# Patient Record
Sex: Female | Born: 1980 | Race: White | Hispanic: No | Marital: Married | Smoking: Never smoker
Health system: Southern US, Community
[De-identification: ages and names within clinical notes are randomized; demographics above are authoritative.]

## PROBLEM LIST (undated history)

## (undated) ENCOUNTER — Inpatient Hospital Stay (HOSPITAL_COMMUNITY): Payer: BC Managed Care – PPO

## (undated) ENCOUNTER — Inpatient Hospital Stay (HOSPITAL_COMMUNITY): Payer: Self-pay

## (undated) DIAGNOSIS — T7840XA Allergy, unspecified, initial encounter: Secondary | ICD-10-CM

## (undated) DIAGNOSIS — Z87442 Personal history of urinary calculi: Secondary | ICD-10-CM

## (undated) DIAGNOSIS — E079 Disorder of thyroid, unspecified: Secondary | ICD-10-CM

## (undated) DIAGNOSIS — Z98891 History of uterine scar from previous surgery: Secondary | ICD-10-CM

## (undated) DIAGNOSIS — K219 Gastro-esophageal reflux disease without esophagitis: Secondary | ICD-10-CM

## (undated) DIAGNOSIS — M1990 Unspecified osteoarthritis, unspecified site: Secondary | ICD-10-CM

## (undated) HISTORY — PX: BLADDER SURGERY: SHX569

## (undated) HISTORY — PX: WISDOM TOOTH EXTRACTION: SHX21

## (undated) SURGERY — Surgical Case
Anesthesia: *Unknown

---

## 2010-02-19 ENCOUNTER — Inpatient Hospital Stay (HOSPITAL_COMMUNITY)
Admission: AD | Admit: 2010-02-19 | Discharge: 2010-02-22 | Payer: Self-pay | Source: Home / Self Care | Attending: Obstetrics and Gynecology | Admitting: Obstetrics and Gynecology

## 2010-05-14 LAB — CBC
Hemoglobin: 11.3 g/dL — ABNORMAL LOW (ref 12.0–15.0)
MCHC: 33.6 g/dL (ref 30.0–36.0)
Platelets: 187 10*3/uL (ref 150–400)
RBC: 3.67 MIL/uL — ABNORMAL LOW (ref 3.87–5.11)
RBC: 4.02 MIL/uL (ref 3.87–5.11)
WBC: 12.8 10*3/uL — ABNORMAL HIGH (ref 4.0–10.5)
WBC: 18 10*3/uL — ABNORMAL HIGH (ref 4.0–10.5)

## 2010-05-14 LAB — RPR: RPR Ser Ql: NONREACTIVE

## 2010-05-14 LAB — ABO/RH: ABO/RH(D): O POS

## 2012-02-11 LAB — OB RESULTS CONSOLE RUBELLA ANTIBODY, IGM: Rubella: IMMUNE

## 2012-02-11 LAB — OB RESULTS CONSOLE HEPATITIS B SURFACE ANTIGEN: Hepatitis B Surface Ag: NEGATIVE

## 2012-05-11 ENCOUNTER — Emergency Department (HOSPITAL_BASED_OUTPATIENT_CLINIC_OR_DEPARTMENT_OTHER)
Admission: EM | Admit: 2012-05-11 | Discharge: 2012-05-11 | Discharge status: Against Medical Advice | Payer: BC Managed Care – PPO | Attending: Emergency Medicine | Admitting: Emergency Medicine

## 2012-05-11 ENCOUNTER — Encounter (HOSPITAL_BASED_OUTPATIENT_CLINIC_OR_DEPARTMENT_OTHER): Payer: Self-pay

## 2012-05-11 DIAGNOSIS — R21 Rash and other nonspecific skin eruption: Secondary | ICD-10-CM | POA: Insufficient documentation

## 2012-05-11 DIAGNOSIS — O9989 Other specified diseases and conditions complicating pregnancy, childbirth and the puerperium: Secondary | ICD-10-CM | POA: Insufficient documentation

## 2012-05-11 NOTE — ED Notes (Signed)
C/o allergic reactions since age 32-states she took benadryl PTA is feeling fine at this time-pt concerned due to she is [redacted] weeks pregnant-pt NAD

## 2012-06-04 LAB — OB RESULTS CONSOLE RPR: RPR: NONREACTIVE

## 2012-08-07 ENCOUNTER — Other Ambulatory Visit: Payer: Self-pay | Admitting: Obstetrics and Gynecology

## 2012-08-15 ENCOUNTER — Encounter (HOSPITAL_COMMUNITY): Payer: Self-pay | Admitting: *Deleted

## 2012-08-15 ENCOUNTER — Inpatient Hospital Stay (HOSPITAL_COMMUNITY)
Admission: AD | Admit: 2012-08-15 | Discharge: 2012-08-15 | Disposition: A | Discharge status: Home / Self Care | Payer: BC Managed Care – PPO | Source: Ambulatory Visit | Attending: Obstetrics and Gynecology | Admitting: Obstetrics and Gynecology

## 2012-08-15 DIAGNOSIS — R112 Nausea with vomiting, unspecified: Secondary | ICD-10-CM | POA: Insufficient documentation

## 2012-08-15 DIAGNOSIS — O479 False labor, unspecified: Secondary | ICD-10-CM | POA: Insufficient documentation

## 2012-08-15 DIAGNOSIS — K625 Hemorrhage of anus and rectum: Secondary | ICD-10-CM | POA: Insufficient documentation

## 2012-08-15 HISTORY — DX: Allergy, unspecified, initial encounter: T78.40XA

## 2012-08-15 NOTE — MAU Note (Signed)
Pt reports being up all night with contractions and diarrhea . Reports she noticed some rectal bleeding when wiping earlier this morning. Good fetal movement reported.

## 2012-08-15 NOTE — Progress Notes (Signed)
Notified pt has been having diarrhea mixed with blood several times this morning and just had another episiode. FHR tracing not reactive at this time. Order to PO hydrate pt.

## 2012-08-20 ENCOUNTER — Encounter (HOSPITAL_COMMUNITY): Payer: Self-pay | Admitting: Pharmacist

## 2012-08-25 ENCOUNTER — Encounter (HOSPITAL_COMMUNITY): Payer: Self-pay | Admitting: *Deleted

## 2012-08-26 ENCOUNTER — Other Ambulatory Visit (HOSPITAL_COMMUNITY): Payer: BC Managed Care – PPO

## 2012-08-26 ENCOUNTER — Encounter (HOSPITAL_COMMUNITY): Payer: Self-pay

## 2012-08-26 ENCOUNTER — Encounter (HOSPITAL_COMMUNITY)
Admission: RE | Admit: 2012-08-26 | Discharge: 2012-08-26 | Disposition: A | Discharge status: Home / Self Care | Payer: BC Managed Care – PPO | Source: Ambulatory Visit | Attending: Obstetrics and Gynecology | Admitting: Obstetrics and Gynecology

## 2012-08-26 HISTORY — DX: Gastro-esophageal reflux disease without esophagitis: K21.9

## 2012-08-26 LAB — CBC
HCT: 34.7 % — ABNORMAL LOW (ref 36.0–46.0)
Hemoglobin: 11.8 g/dL — ABNORMAL LOW (ref 12.0–15.0)
MCH: 31.3 pg (ref 26.0–34.0)
MCHC: 34 g/dL (ref 30.0–36.0)
MCV: 92 fL (ref 78.0–100.0)
Platelets: 237 10*3/uL (ref 150–400)
RBC: 3.77 MIL/uL — ABNORMAL LOW (ref 3.87–5.11)
RDW: 13.1 % (ref 11.5–15.5)
WBC: 10.9 10*3/uL — ABNORMAL HIGH (ref 4.0–10.5)

## 2012-08-26 LAB — SYPHILIS: RPR W/REFLEX TO RPR TITER AND TREPONEMAL ANTIBODIES, TRADITIONAL SCREENING AND DIAGNOSIS ALGORITHM: RPR Ser Ql: NONREACTIVE

## 2012-08-26 LAB — TYPE AND SCREEN
ABO/RH(D): O POS
Antibody Screen: NEGATIVE

## 2012-08-26 NOTE — Patient Instructions (Addendum)
[address] Cheryl Medina  08/26/2012   Your procedure is scheduled on:  08/28/12  Enter through the Main Entrance of [facility] at 845 AM.  Pick up the phone at the desk and dial [phone].   Call this number if you have problems the morning of surgery: [phone]   Remember:   Do not eat food:After Midnight.  Do not drink clear liquids: After Midnight.  Take these medicines the morning of surgery with A SIP OF WATER: NA   Do not wear jewelry, make-up or nail polish.  Do not wear lotions, powders, or perfumes. You may wear deodorant.  Do not shave 48 hours prior to surgery.  Do not bring valuables to the hospital.  [facility] is not responsible                  for any belongings or valuables brought to the hospital.  Contacts, dentures or bridgework may not be worn into surgery.  Leave suitcase in the car. After surgery it may be brought to your room.  For patients admitted to the hospital, checkout time is 11:00 AM the day of                discharge.   Patients discharged the day of surgery will not be allowed to drive                   home.  Name and phone number of your driver: na  Special Instructions: Shower using CHG 2 nights before surgery and the night before surgery.  If you shower the day of surgery use CHG.  Use special wash - you have one bottle of CHG for all showers.  You should use approximately 1/3 of the bottle for each shower.   Please read over the following fact sheets that you were given: Surgical Site Infection Prevention

## 2012-08-27 NOTE — H&P (Signed)
[name], Cheryl Medina                ACCOUNT NO.:  [id number]  MEDICAL RECORD NO.:  [id number]  LOCATION:  PERIO                         FACILITY:  [facility]  PHYSICIAN:  [name], M.D.DATE OF BIRTH:  03-04-1981  DATE OF ADMISSION:  07/16/2012 DATE OF DISCHARGE:                             HISTORY & PHYSICAL   CHIEF COMPLAINT:  Repeat C-section.  HISTORY OF PRESENT ILLNESS:  A 32 year old white female, G5, P1 at [redacted] weeks gestation for repeat cesarean section.  MEDICATIONS:  Zofran as needed, prenatal vitamins, Pepcid, and Zyrtec as needed.  SOCIAL HISTORY:  She is a nonsmoker, nondrinker.  Denies domestic physical violence.  FAMILY HISTORY:  Heart disease, schizophrenia, kidney stone, skin cancer.  SURGICAL HISTORY:  Remarkable for C-section x1.  She has a history of questionable bladder surgery in 1988.  Prenatal course complicated by multiple unspecified allergic reactions with multiple ER visits due to syncope.  No workup done at this time.  Previous workup reportedly normal.  PHYSICAL EXAMINATION:  GENERAL:  She is a well-developed, well- nourished, white female, in no acute distress. HEENT:  Normal. NECK:  Supple.  Full range of motion. LUNGS:  Clear. HEART:  Regular rhythm. ABDOMEN:  Soft, gravid, nontender.  Estimated fetal weight 6.5 to 7 pounds.  Cervix is closed, 50%, vertex, -2. EXTREMITIES:  There are no cords. NEUROLOGIC:  Nonfocal. SKIN:  Intact.  IMPRESSION: 1. A 39-week intrauterine pregnancy. 2. Presumed __________. 3. Unspecified allergic reaction.  PLAN:  To proceed with elective repeat low segment transverse cesarean section.  Risks of anesthesia, infection, bleeding, injury to surrounding organs, possible need for repair is discussed, delayed versus immediate complications to include bowel and bladder injury noted.  The patient acknowledges and wishes to proceed.     [name], M.D.     RJT/MEDQ  D:  08/27/2012  T:   08/27/2012  Job:  [id number]

## 2012-08-28 ENCOUNTER — Inpatient Hospital Stay (HOSPITAL_COMMUNITY): Payer: BC Managed Care – PPO | Admitting: Anesthesiology

## 2012-08-28 ENCOUNTER — Inpatient Hospital Stay (HOSPITAL_COMMUNITY)
Admission: AD | Admit: 2012-08-28 | Discharge: 2012-08-30 | DRG: 371 | Disposition: A | Discharge status: Home / Self Care | Payer: BC Managed Care – PPO | Source: Ambulatory Visit | Attending: Obstetrics and Gynecology | Admitting: Obstetrics and Gynecology

## 2012-08-28 ENCOUNTER — Encounter (HOSPITAL_COMMUNITY)
Admission: AD | Disposition: A | Discharge status: Home / Self Care | Payer: Self-pay | Source: Ambulatory Visit | Attending: Obstetrics and Gynecology

## 2012-08-28 ENCOUNTER — Encounter (HOSPITAL_COMMUNITY): Payer: Self-pay | Admitting: Anesthesiology

## 2012-08-28 DIAGNOSIS — Z98891 History of uterine scar from previous surgery: Secondary | ICD-10-CM

## 2012-08-28 DIAGNOSIS — O34219 Maternal care for unspecified type scar from previous cesarean delivery: Principal | ICD-10-CM | POA: Diagnosis present

## 2012-08-28 HISTORY — DX: History of uterine scar from previous surgery: Z98.891

## 2012-08-28 SURGERY — Surgical Case
Anesthesia: Spinal | Site: Abdomen | Wound class: Clean Contaminated

## 2012-08-28 MED ORDER — FENTANYL CITRATE 0.05 MG/ML IJ SOLN
INTRAMUSCULAR | Status: DC | PRN
  Administered 2012-08-28: 75 ug via INTRAVENOUS
  Administered 2012-08-28: 25 ug via INTRATHECAL

## 2012-08-28 MED ORDER — PHENYLEPHRINE 40 MCG/ML (10ML) SYRINGE FOR IV PUSH (FOR BLOOD PRESSURE SUPPORT)
PREFILLED_SYRINGE | INTRAVENOUS | Status: AC
  Filled 2012-08-28: qty 5

## 2012-08-28 MED ORDER — SIMETHICONE 80 MG PO CHEW
80.0000 mg | CHEWABLE_TABLET | Freq: Three times a day (TID) | ORAL | Status: DC
  Administered 2012-08-28 – 2012-08-30 (×7): 80 mg via ORAL

## 2012-08-28 MED ORDER — SIMETHICONE 80 MG PO CHEW: 80.0000 mg | CHEWABLE_TABLET | ORAL | Status: DC | PRN

## 2012-08-28 MED ORDER — DIPHENHYDRAMINE HCL 25 MG PO CAPS: 25.0000 mg | ORAL_CAPSULE | ORAL | Status: DC | PRN

## 2012-08-28 MED ORDER — METOCLOPRAMIDE HCL 5 MG/ML IJ SOLN: 10.0000 mg | Freq: Three times a day (TID) | INTRAMUSCULAR | Status: DC | PRN

## 2012-08-28 MED ORDER — MORPHINE SULFATE (PF) 0.5 MG/ML IJ SOLN
INTRAMUSCULAR | Status: DC | PRN
  Administered 2012-08-28: .15 mg via INTRATHECAL

## 2012-08-28 MED ORDER — OXYTOCIN 10 UNIT/ML IJ SOLN
40.0000 [IU] | INTRAVENOUS | Status: DC | PRN
  Administered 2012-08-28: 40 [IU] via INTRAVENOUS

## 2012-08-28 MED ORDER — ONDANSETRON HCL 4 MG/2ML IJ SOLN: 4.0000 mg | INTRAMUSCULAR | Status: DC | PRN

## 2012-08-28 MED ORDER — SODIUM CHLORIDE 0.9 % IJ SOLN: 3.0000 mL | INTRAMUSCULAR | Status: DC | PRN

## 2012-08-28 MED ORDER — NALBUPHINE HCL 10 MG/ML IJ SOLN
5.0000 mg | INTRAMUSCULAR | Status: DC | PRN
  Filled 2012-08-28: qty 1

## 2012-08-28 MED ORDER — ONDANSETRON HCL 4 MG PO TABS: 4.0000 mg | ORAL_TABLET | ORAL | Status: DC | PRN

## 2012-08-28 MED ORDER — WITCH HAZEL-GLYCERIN EX PADS: 1.0000 "application " | MEDICATED_PAD | CUTANEOUS | Status: DC | PRN

## 2012-08-28 MED ORDER — KETOROLAC TROMETHAMINE 30 MG/ML IJ SOLN: 30.0000 mg | Freq: Four times a day (QID) | INTRAMUSCULAR | Status: AC | PRN

## 2012-08-28 MED ORDER — PHENYLEPHRINE 40 MCG/ML (10ML) SYRINGE FOR IV PUSH (FOR BLOOD PRESSURE SUPPORT)
PREFILLED_SYRINGE | INTRAVENOUS | Status: AC
  Filled 2012-08-28: qty 10

## 2012-08-28 MED ORDER — METHYLERGONOVINE MALEATE 0.2 MG/ML IJ SOLN: 0.2000 mg | INTRAMUSCULAR | Status: DC | PRN

## 2012-08-28 MED ORDER — PHENYLEPHRINE HCL 10 MG/ML IJ SOLN
INTRAMUSCULAR | Status: DC | PRN
  Administered 2012-08-28 (×2): 40 ug via INTRAVENOUS
  Administered 2012-08-28: 80 ug via INTRAVENOUS
  Administered 2012-08-28: 40 ug via INTRAVENOUS
  Administered 2012-08-28 (×2): 80 ug via INTRAVENOUS
  Administered 2012-08-28: 40 ug via INTRAVENOUS
  Administered 2012-08-28 (×2): 80 ug via INTRAVENOUS

## 2012-08-28 MED ORDER — OXYTOCIN 40 UNITS IN LACTATED RINGERS INFUSION - SIMPLE MED: 62.5000 mL/h | INTRAVENOUS | Status: AC

## 2012-08-28 MED ORDER — DIBUCAINE 1 % RE OINT: 1.0000 "application " | TOPICAL_OINTMENT | RECTAL | Status: DC | PRN

## 2012-08-28 MED ORDER — DIPHENHYDRAMINE HCL 50 MG/ML IJ SOLN: 12.5000 mg | INTRAMUSCULAR | Status: DC | PRN

## 2012-08-28 MED ORDER — BUPIVACAINE HCL (PF) 0.25 % IJ SOLN
INTRAMUSCULAR | Status: AC
  Filled 2012-08-28: qty 30

## 2012-08-28 MED ORDER — DIPHENHYDRAMINE HCL 50 MG/ML IJ SOLN: 25.0000 mg | INTRAMUSCULAR | Status: DC | PRN

## 2012-08-28 MED ORDER — CEFAZOLIN SODIUM-DEXTROSE 2-3 GM-% IV SOLR
INTRAVENOUS | Status: AC
  Filled 2012-08-28: qty 50

## 2012-08-28 MED ORDER — SCOPOLAMINE 1 MG/3DAYS TD PT72
MEDICATED_PATCH | TRANSDERMAL | Status: AC
  Administered 2012-08-28: 1.5 mg via TRANSDERMAL
  Filled 2012-08-28: qty 1

## 2012-08-28 MED ORDER — MORPHINE SULFATE 0.5 MG/ML IJ SOLN
INTRAMUSCULAR | Status: AC
  Filled 2012-08-28: qty 10

## 2012-08-28 MED ORDER — LACTATED RINGERS IV SOLN
Freq: Once | INTRAVENOUS | Status: AC
  Administered 2012-08-28: 10:00:00 via INTRAVENOUS

## 2012-08-28 MED ORDER — OXYTOCIN 10 UNIT/ML IJ SOLN
INTRAMUSCULAR | Status: AC
  Filled 2012-08-28: qty 4

## 2012-08-28 MED ORDER — ONDANSETRON HCL 4 MG/2ML IJ SOLN
INTRAMUSCULAR | Status: AC
  Filled 2012-08-28: qty 2

## 2012-08-28 MED ORDER — FENTANYL CITRATE 0.05 MG/ML IJ SOLN: 25.0000 ug | INTRAMUSCULAR | Status: DC | PRN

## 2012-08-28 MED ORDER — TETANUS-DIPHTH-ACELL PERTUSSIS 5-2.5-18.5 LF-MCG/0.5 IM SUSP: 0.5000 mL | Freq: Once | INTRAMUSCULAR | Status: DC

## 2012-08-28 MED ORDER — ACETAMINOPHEN 10 MG/ML IV SOLN
1000.0000 mg | Freq: Four times a day (QID) | INTRAVENOUS | Status: AC | PRN
  Filled 2012-08-28: qty 100

## 2012-08-28 MED ORDER — DIPHENHYDRAMINE HCL 25 MG PO CAPS: 25.0000 mg | ORAL_CAPSULE | Freq: Four times a day (QID) | ORAL | Status: DC | PRN

## 2012-08-28 MED ORDER — CEFAZOLIN SODIUM-DEXTROSE 2-3 GM-% IV SOLR
2.0000 g | INTRAVENOUS | Status: AC
  Administered 2012-08-28: 2 g via INTRAVENOUS

## 2012-08-28 MED ORDER — PRENATAL MULTIVITAMIN CH
1.0000 | ORAL_TABLET | Freq: Every day | ORAL | Status: DC
  Administered 2012-08-29: 1 via ORAL
  Filled 2012-08-28: qty 1

## 2012-08-28 MED ORDER — KETOROLAC TROMETHAMINE 30 MG/ML IJ SOLN
INTRAMUSCULAR | Status: AC
  Administered 2012-08-28: 30 mg via INTRAVENOUS
  Filled 2012-08-28: qty 1

## 2012-08-28 MED ORDER — LACTATED RINGERS IV SOLN
INTRAVENOUS | Status: DC | PRN
  Administered 2012-08-28: 1000 mL
  Administered 2012-08-28 (×2): via INTRAVENOUS

## 2012-08-28 MED ORDER — MEPERIDINE HCL 25 MG/ML IJ SOLN: 6.2500 mg | INTRAMUSCULAR | Status: DC | PRN

## 2012-08-28 MED ORDER — NALBUPHINE HCL 10 MG/ML IJ SOLN
5.0000 mg | INTRAMUSCULAR | Status: DC | PRN
  Administered 2012-08-28: 5 mg via INTRAVENOUS
  Filled 2012-08-28 (×2): qty 1

## 2012-08-28 MED ORDER — METHYLERGONOVINE MALEATE 0.2 MG PO TABS
0.2000 mg | ORAL_TABLET | ORAL | Status: DC | PRN
Start: 2012-08-28 — End: 2012-08-30

## 2012-08-28 MED ORDER — MENTHOL 3 MG MT LOZG: 1.0000 | LOZENGE | OROMUCOSAL | Status: DC | PRN

## 2012-08-28 MED ORDER — SCOPOLAMINE 1 MG/3DAYS TD PT72
1.0000 | MEDICATED_PATCH | Freq: Once | TRANSDERMAL | Status: DC
  Filled 2012-08-28: qty 1

## 2012-08-28 MED ORDER — FENTANYL CITRATE 0.05 MG/ML IJ SOLN
INTRAMUSCULAR | Status: AC
  Filled 2012-08-28: qty 2

## 2012-08-28 MED ORDER — NALOXONE HCL 0.4 MG/ML IJ SOLN: 0.4000 mg | INTRAMUSCULAR | Status: DC | PRN

## 2012-08-28 MED ORDER — IBUPROFEN 600 MG PO TABS
600.0000 mg | ORAL_TABLET | Freq: Four times a day (QID) | ORAL | Status: DC
  Administered 2012-08-28 – 2012-08-30 (×8): 600 mg via ORAL
  Filled 2012-08-28 (×8): qty 1

## 2012-08-28 MED ORDER — SCOPOLAMINE 1 MG/3DAYS TD PT72
1.0000 | MEDICATED_PATCH | Freq: Once | TRANSDERMAL | Status: DC
  Administered 2012-08-28: 1.5 mg via TRANSDERMAL

## 2012-08-28 MED ORDER — LANOLIN HYDROUS EX OINT: 1.0000 "application " | TOPICAL_OINTMENT | CUTANEOUS | Status: DC | PRN

## 2012-08-28 MED ORDER — ONDANSETRON HCL 4 MG/2ML IJ SOLN
INTRAMUSCULAR | Status: DC | PRN
  Administered 2012-08-28: 4 mg via INTRAVENOUS

## 2012-08-28 MED ORDER — SENNOSIDES-DOCUSATE SODIUM 8.6-50 MG PO TABS
2.0000 | ORAL_TABLET | Freq: Every day | ORAL | Status: DC
  Administered 2012-08-28 – 2012-08-29 (×2): 2 via ORAL

## 2012-08-28 MED ORDER — NALBUPHINE SYRINGE 5 MG/0.5 ML
INJECTION | INTRAMUSCULAR | Status: AC
  Filled 2012-08-28: qty 0.5

## 2012-08-28 MED ORDER — LACTATED RINGERS IV SOLN
INTRAVENOUS | Status: DC | PRN
  Administered 2012-08-28: 11:00:00 via INTRAVENOUS

## 2012-08-28 MED ORDER — NALOXONE HCL 1 MG/ML IJ SOLN
1.0000 ug/kg/h | INTRAVENOUS | Status: DC | PRN
  Filled 2012-08-28: qty 2

## 2012-08-28 MED ORDER — BUPIVACAINE IN DEXTROSE 0.75-8.25 % IT SOLN
INTRATHECAL | Status: DC | PRN
  Administered 2012-08-28: 1.7 mL via INTRATHECAL

## 2012-08-28 MED ORDER — BUPIVACAINE HCL (PF) 0.25 % IJ SOLN
INTRAMUSCULAR | Status: DC | PRN
  Administered 2012-08-28: 10 mL

## 2012-08-28 MED ORDER — LACTATED RINGERS IV SOLN: INTRAVENOUS | Status: DC

## 2012-08-28 MED ORDER — ZOLPIDEM TARTRATE 5 MG PO TABS: 5.0000 mg | ORAL_TABLET | Freq: Every evening | ORAL | Status: DC | PRN

## 2012-08-28 MED ORDER — ONDANSETRON HCL 4 MG/2ML IJ SOLN: 4.0000 mg | Freq: Three times a day (TID) | INTRAMUSCULAR | Status: DC | PRN

## 2012-08-28 MED ORDER — OXYCODONE-ACETAMINOPHEN 5-325 MG PO TABS
1.0000 | ORAL_TABLET | ORAL | Status: DC | PRN
  Administered 2012-08-29: 1 via ORAL
  Administered 2012-08-29: 2 via ORAL
  Administered 2012-08-29: 1 via ORAL
  Administered 2012-08-30 (×2): 2 via ORAL
  Filled 2012-08-28 (×2): qty 1
  Filled 2012-08-28 (×3): qty 2

## 2012-08-28 SURGICAL SUPPLY — 33 items
CLAMP CORD UMBIL (MISCELLANEOUS) IMPLANT
CLOTH BEACON ORANGE TIMEOUT ST (SAFETY) ×2 IMPLANT
CONTAINER PREFILL 10% NBF 15ML (MISCELLANEOUS) IMPLANT
DERMABOND ADVANCED (GAUZE/BANDAGES/DRESSINGS) ×1
DERMABOND ADVANCED .7 DNX12 (GAUZE/BANDAGES/DRESSINGS) ×1 IMPLANT
DRAPE LG THREE QUARTER DISP (DRAPES) ×2 IMPLANT
DRSG OPSITE POSTOP 4X10 (GAUZE/BANDAGES/DRESSINGS) ×2 IMPLANT
DURAPREP 26ML APPLICATOR (WOUND CARE) ×2 IMPLANT
ELECT REM PT RETURN 9FT ADLT (ELECTROSURGICAL) ×2
ELECTRODE REM PT RTRN 9FT ADLT (ELECTROSURGICAL) ×1 IMPLANT
EXTRACTOR VACUUM M CUP 4 TUBE (SUCTIONS) ×2 IMPLANT
GLOVE BIO SURGEON STRL SZ7.5 (GLOVE) ×2 IMPLANT
GOWN PREVENTION PLUS XLARGE (GOWN DISPOSABLE) ×2 IMPLANT
GOWN STRL REIN XL XLG (GOWN DISPOSABLE) ×4 IMPLANT
KIT ABG SYR 3ML LUER SLIP (SYRINGE) IMPLANT
NEEDLE HYPO 25X1 1.5 SAFETY (NEEDLE) ×2 IMPLANT
NEEDLE HYPO 25X5/8 SAFETYGLIDE (NEEDLE) IMPLANT
NS IRRIG 1000ML POUR BTL (IV SOLUTION) ×2 IMPLANT
PACK C SECTION WH (CUSTOM PROCEDURE TRAY) ×2 IMPLANT
STAPLER VISISTAT 35W (STAPLE) ×2 IMPLANT
SUT MNCRL 0 VIOLET CTX 36 (SUTURE) ×2 IMPLANT
SUT MNCRL AB 4-0 PS2 18 (SUTURE) ×2 IMPLANT
SUT MON AB 2-0 CT1 27 (SUTURE) ×2 IMPLANT
SUT MON AB-0 CT1 36 (SUTURE) ×4 IMPLANT
SUT MONOCRYL 0 CTX 36 (SUTURE) ×2
SUT PLAIN 0 NONE (SUTURE) IMPLANT
SUT PLAIN 2 0 (SUTURE) ×1
SUT PLAIN 2 0 XLH (SUTURE) IMPLANT
SUT PLAIN ABS 2-0 CT1 27XMFL (SUTURE) ×1 IMPLANT
SYR CONTROL 10ML LL (SYRINGE) ×2 IMPLANT
TOWEL OR 17X24 6PK STRL BLUE (TOWEL DISPOSABLE) ×6 IMPLANT
TRAY FOLEY CATH 14FR (SET/KITS/TRAYS/PACK) ×2 IMPLANT
WATER STERILE IRR 1000ML POUR (IV SOLUTION) ×2 IMPLANT

## 2012-08-28 NOTE — Anesthesia Postprocedure Evaluation (Signed)
  Anesthesia Post-op Note  Patient: Cheryl Medina  Procedure(s) Performed: Procedure(s) with comments: REPEAT CESAREAN SECTION (N/A) - EDD 09/02/12  Patient Location: PACU  Anesthesia Type:Spinal  Level of Consciousness: awake, alert  and oriented  Airway and Oxygen Therapy: Patient Spontanous Breathing  Post-op Pain: none  Post-op Assessment: Post-op Vital signs reviewed, Patient's Cardiovascular Status Stable, Respiratory Function Stable, Patent Airway, No signs of Nausea or vomiting, Pain level controlled, No headache and No backache  Post-op Vital Signs: Reviewed and stable  Complications: No apparent anesthesia complications

## 2012-08-28 NOTE — Anesthesia Procedure Notes (Signed)
Spinal  Patient location during procedure: OR Start time: 08/28/2012 10:34 AM Staffing Anesthesiologist: Cainen Burnham A. Performed by: anesthesiologist  Preanesthetic Checklist Completed: patient identified, site marked, surgical consent, pre-op evaluation, timeout performed, IV checked, risks and benefits discussed and monitors and equipment checked Spinal Block Patient position: sitting Prep: site prepped and draped and DuraPrep Patient monitoring: heart rate, cardiac monitor, continuous pulse ox and blood pressure Approach: midline Location: L3-4 Injection technique: single-shot Needle Needle type: Sprotte  Needle gauge: 24 G Needle length: 9 cm Needle insertion depth: 6 cm Assessment Sensory level: T4 Additional Notes Patient tolerated procedure well. Adequate sensory level.

## 2012-08-28 NOTE — Transfer of Care (Signed)
Immediate Anesthesia Transfer of Care Note  Patient: Cheryl Medina  Procedure(s) Performed: Procedure(s) with comments: REPEAT CESAREAN SECTION (N/A) - EDD 09/02/12  Patient Location: PACU  Anesthesia Type:Spinal  Level of Consciousness: awake, alert  and oriented  Airway & Oxygen Therapy: Patient Spontanous Breathing  Post-op Assessment: Report given to PACU RN and Post -op Vital signs reviewed and stable  Post vital signs: Reviewed and stable  Complications: No apparent anesthesia complications

## 2012-08-28 NOTE — Anesthesia Preprocedure Evaluation (Signed)
Anesthesia Evaluation  Patient identified by MRN, date of birth, ID band  Reviewed: Allergy & Precautions, H&P , NPO status , Patient's Chart, lab work & pertinent test results  Airway Mallampati: II TM Distance: >3 FB Neck ROM: Full    Dental no notable dental hx. (+) Teeth Intact   Pulmonary neg pulmonary ROS,  breath sounds clear to auscultation  Pulmonary exam normal       Cardiovascular negative cardio ROS  Rhythm:Regular Rate:Normal     Neuro/Psych negative neurological ROS  negative psych ROS   GI/Hepatic Neg liver ROS, GERD-  Medicated and Controlled,  Endo/Other  negative endocrine ROS  Renal/GU negative Renal ROS  negative genitourinary   Musculoskeletal negative musculoskeletal ROS (+)   Abdominal   Peds  Hematology negative hematology ROS (+)   Anesthesia Other Findings   Reproductive/Obstetrics Previous C/Section                           Anesthesia Physical Anesthesia Plan  ASA: II  Anesthesia Plan: Spinal   Post-op Pain Management:    Induction:   Airway Management Planned: Natural Airway  Additional Equipment:   Intra-op Plan:   Post-operative Plan:   Informed Consent: I have reviewed the patients History and Physical, chart, labs and discussed the procedure including the risks, benefits and alternatives for the proposed anesthesia with the patient or authorized representative who has indicated his/her understanding and acceptance.   Dental advisory given  Plan Discussed with: Anesthesiologist, CRNA and Surgeon  Anesthesia Plan Comments:         Anesthesia Quick Evaluation

## 2012-08-28 NOTE — Preoperative (Signed)
Beta Blockers   Reason not to administer Beta Blockers:Not Applicable 

## 2012-08-28 NOTE — Progress Notes (Signed)
Patient ID: Cheryl Medina, female   DOB: 03/31/1980, 32 y.o.   MRN: [id number] Patient seen and examined. Consent witnessed and signed. No changes noted. Update completed.

## 2012-08-28 NOTE — Consult Note (Signed)
Neonatology Note:  Attendance at C-section:  I was asked by Dr. Taavon to attend this repeat C/S at term. The mother is a G2P1 O pos, GBS neg with an uncomplicated pregnancy. ROM at delivery, fluid clear. Infant vigorous with good spontaneous cry and tone. Needed only minimal bulb suctioning. Ap 9/9. Lungs clear to ausc in DR. To CN to care of Pediatrician.  Cheryl Medina C. Mirtie Bastyr, MD  

## 2012-08-28 NOTE — Op Note (Signed)
Cesarean Section Procedure Note  Indications: Previous Csection x one for rpt  Pre-operative Diagnosis: 39 week 2 day pregnancy.  Post-operative Diagnosis: same  Surgeon: [name]   Assistants: Cherly Hensen, MD  Anesthesia: Local anesthesia 0.25.% bupivacaine and Spinal  ASA Class: 2  Procedure Details  The patient was seen in the Holding Room. The risks, benefits, complications, treatment options, and expected outcomes were discussed with the patient.  The patient concurred with the proposed plan, giving informed consent. The risks of anesthesia, infection, bleeding and possible injury to other organs discussed. Injury to bowel, bladder, or ureter with possible need for repair discussed. Possible need for transfusion with secondary risks of hepatitis or HIV acquisition discussed. Post operative complications to include but not limited to DVT, PE and Pneumonia noted. The site of surgery properly noted/marked. The patient was taken to Operating Room # 9, identified as DESHONDRA WORST and the procedure verified as C-Section Delivery. A Time Out was held and the above information confirmed.  After induction of anesthesia, the patient was draped and prepped in the usual sterile manner. A Pfannenstiel incision was made and carried down through the subcutaneous tissue to the fascia. Fascial incision was made and extended transversely using Mayo scissors. The fascia was separated from the underlying rectus tissue superiorly and inferiorly. The peritoneum was identified and entered. Peritoneal incision was extended longitudinally. The utero-vesical peritoneal reflection was incised transversely and the bladder flap was bluntly freed from the lower uterine segment. A low transverse uterine incision(Kerr hysterotomy) was made. Delivered from OA presentation with vacuum assistance x one pull was a  female with Apgar scores of 9 at one minute and 9 at five minutes. Bulb suctioning gently performed. Neonatal  team in attendance.After the umbilical cord was clamped and cut cord blood was obtained for evaluation. The placenta was removed intact and appeared normal. The uterus was curetted with a dry lap pack. Good hemostasis was noted.The uterine outline, tubes and ovaries appeared normal. The uterine incision was closed with running locked sutures of 0 Monocryl x 2 layers. Hemostasis was observed. The parietal peritoneum was closed with a running 2-0 Monocryl suture. The fascia was then reapproximated with running sutures of 0 Monocryl. The skin was reapproximated with 4-0 Monocryl.  Instrument, sponge, and needle counts were correct prior the abdominal closure and at the conclusion of the case.   Findings: Thin LUS, nl tubes and nl ovaries, anterior placenta  Estimated Blood Loss:  500         Drains: foley                 Specimens: placenta                 Complications:  None; patient tolerated the procedure well.         Disposition: PACU - hemodynamically stable.         Condition: stable  Attending Attestation: I performed the procedure.

## 2012-08-29 ENCOUNTER — Encounter (HOSPITAL_COMMUNITY): Payer: Self-pay | Admitting: Obstetrics and Gynecology

## 2012-08-29 DIAGNOSIS — Z98891 History of uterine scar from previous surgery: Secondary | ICD-10-CM

## 2012-08-29 HISTORY — DX: History of uterine scar from previous surgery: Z98.891

## 2012-08-29 LAB — CBC
HCT: 29.3 % — ABNORMAL LOW (ref 36.0–46.0)
MCH: 31.4 pg (ref 26.0–34.0)
MCV: 92.1 fL (ref 78.0–100.0)
RDW: 13.1 % (ref 11.5–15.5)
WBC: 11.4 10*3/uL — ABNORMAL HIGH (ref 4.0–10.5)

## 2012-08-29 NOTE — Progress Notes (Signed)
Patient ID: Cheryl Medina, female   DOB: 1981-02-27, 32 y.o.   MRN: [id number] POD # 1  Subjective: Pt reports feeling well/ Pain controlled with ibuprofen and percocet Tolerating po/ Foley d/c'ed and pt has voided x 1/ No n/v/Flatus pos Activity: out of bed and ambulate Bleeding is light Newborn info:  Information for the patient's newborn:  Ilamae, Geng [[id number]]  female  / circ desired; plan for today/ Feeding: breast   Objective: VS: Blood pressure 94/60, pulse 63, temperature 98.1 F (36.7 C), temperature source Oral, resp. rate 18.    Intake/Output Summary (Last 24 hours) at 08/29/12 0944 Last data filed at 08/29/12 0807  Gross per 24 hour  Intake   4360 ml  Output   4425 ml  Net    -65 ml      Recent Labs  08/26/12 1500 08/29/12 0625  WBC 10.9* 11.4*  HGB 11.8* 10.0*  HCT 34.7* 29.3*  PLT 237 172    Blood type: O POS Rubella: Immune   Physical Exam:  General: alert, cooperative and no distress CV: Regular rate and rhythm Resp: clear Abdomen: soft, nontender, normal bowel sounds Incision: Covered with Tegaderm and honeycomb dressing; well approximated.  Uterine Fundus: firm, below umbilicus, nontender Lochia: minimal Ext: edema trace and Homans sign is negative, no sign of DVT    A/P: POD # 1/ G2P2002 S/P C/Section d/t elective repeat Doing well and considering early d/c home tomorrow. Continue routine post op orders   Signed: Demetrius Revel, MSN, [facility] 08/29/2012, 9:44 AM

## 2012-08-30 MED ORDER — FERRALET 90 90-1 MG PO TABS: 1.0000 | ORAL_TABLET | Freq: Every day | ORAL | Status: AC

## 2012-08-30 MED ORDER — IBUPROFEN 600 MG PO TABS: 600.0000 mg | ORAL_TABLET | Freq: Four times a day (QID) | ORAL | Status: AC | PRN

## 2012-08-30 MED ORDER — OXYCODONE-ACETAMINOPHEN 5-325 MG PO TABS: 1.0000 | ORAL_TABLET | ORAL | Status: DC | PRN

## 2012-08-30 NOTE — Discharge Summary (Signed)
Obstetric Discharge Summary Reason for Admission: G2 P1 0 0 1 for elective repeat c/s @ 39wks Prenatal Procedures: u/s, NST Intrapartum Procedures: cesarean: low cervical, transverse Postpartum Procedures: none Complications-Operative and Postpartum: none Hemoglobin  Date Value Range Status  08/29/2012 10.0* 12.0 - 15.0 g/dL Final     HCT  Date Value Range Status  08/29/2012 29.3* 36.0 - 46.0 % Final    Physical Exam:  General: alert, cooperative and no distress Lochia: appropriate Uterine Fundus: firm Incision: well approximated with subcuticular closure.  Tegaderm/honeycomb dressing removed prior to d/c home DVT Evaluation: No evidence of DVT seen on physical exam. Negative Homan's sign.  Discharge Diagnoses: G2 P2 s/p c/s @ 39wks  Discharge Information: Date: 08/30/2012 Activity: pelvic rest Diet: routine Medications: PNV, Ibuprofen, Colace and Percocet Condition: stable Instructions: refer to practice specific booklet Discharge to: home Follow-up Information   Follow up with Cheryl Aden, MD In 6 weeks.   Contact information:   Cheryl Medina [place] [address] [phone]       Newborn Data: Live born female on 08/28/12 Birth Weight: 6 lb 8.4 oz (2960 g) APGAR: 9, 9  Home with mother.  Cheryl Medina K 08/30/2012, 9:43 AM

## 2012-08-30 NOTE — Progress Notes (Signed)
Patient ID: Cheryl Medina, female   DOB: 05-04-1980, 32 y.o.   MRN: [id number] POD # 2  Subjective: Pt reports feeling well and eager for d/c home/ Pain controlled with ibuprofen and percocet Tolerating po/Voiding without problems/ No n/v/Flatus pos Activity: out of bed and ambulate Bleeding is light Newborn info:  Information for the patient's newborn:  Kairi, Harshbarger [[id number]]  female  / circ complete/ Feeding: breast   Objective: VS: Blood pressure 89/60, pulse 58, temperature 97.7 F (36.5 C), temperature source Oral, resp. rate 18, weight 79.833 kg (176 lb), SpO2 98.00%, unknown if currently breastfeeding.   LABS:  Recent Labs  08/29/12 0625  WBC 11.4*  HGB 10.0*  HCT 29.3*  PLT 172     Physical Exam:  General: alert, cooperative and no distress CV: Regular rate and rhythm Resp: clear Abdomen: soft, nontender, normal bowel sounds Incision: Covered with Tegaderm and honeycomb dressing; well approximated. Uterine Fundus: firm, below umbilicus, nontender Lochia: minimal Ext: edema trace and Homans sign is negative, no sign of DVT    A/P: POD # 3/ G2P2002/ S/P C/Section d/t elective repeat Doing well and stable for discharge home RX's: Ibuprofen 600mg  po Q 6 hrs prn pain #30 Refill x 1 Percocet 5/325 1 - 2 tabs po every 6 hrs prn pain  #30 No refill Niferex 150mg  po QD/BID #30/#60 Refill x 1 Rt pp visit in 6 wks    Signed: Demetrius Revel, MSN, [facility] 08/30/2012, 9:40 AM

## 2012-08-31 ENCOUNTER — Encounter (HOSPITAL_COMMUNITY): Payer: Self-pay | Admitting: Obstetrics and Gynecology

## 2012-08-31 ENCOUNTER — Other Ambulatory Visit (HOSPITAL_COMMUNITY): Payer: BC Managed Care – PPO

## 2012-09-21 ENCOUNTER — Ambulatory Visit (HOSPITAL_COMMUNITY): Payer: BC Managed Care – PPO

## 2012-09-23 ENCOUNTER — Ambulatory Visit (HOSPITAL_COMMUNITY)
Admission: RE | Admit: 2012-09-23 | Discharge: 2012-09-23 | Disposition: A | Discharge status: Home / Self Care | Payer: BC Managed Care – PPO | Source: Ambulatory Visit | Attending: Obstetrics and Gynecology | Admitting: Obstetrics and Gynecology

## 2012-09-23 NOTE — Lactation Note (Signed)
Adult Lactation Consultation Outpatient Visit Note  Patient Name: TIERRAH ANASTOS                                  "Kennedy Bucker"                                                                                       BW:6-8 Date of Birth: [birth year]                                                todays weight:8.1,3668  Gestational Age at Delivery: Unknown Type of Delivery:   Breastfeeding History: Frequency of Breastfeeding:  Length of Feeding:   Voids: 10-12 Stools:10 yellow seedy   Supplementing / Method: none Pumping:  Type of Pump:Medela Pump n style   Frequency:none  Volume:    Comments: Mother states she has very painful (L) nipple. She states she saw Dr Seymour Bars yesterday. Mother states that Dr Seymour Bars did not see a problem and ordered her lidocaine gel for nipple pain. Mother has not filled the Rx. Mother states that she is unable to tolerate infant feeding on the (L) breast she describes severe pain with feedings.   Consultation Evaluation:Mother has a milk bleb at 12 oclock on (L) nipple. Mother also has firm area above nipple at 1-2 oclock. Nipple is pink. (R) nipple is only slightly pink. Mother states that she noticed the white spot 1-2 weeks ago and it has become very painful. She states now that the pain in not bearable.  Assist mother in latching infant on (L) breast in football hold. Mother has been using cradle hold without pillow support for her feeding. Recommend that mother use football or cross cradle. Assist mother using proper technique. Mother described pain of #5 with initial latch. Assist with widening infants gape and mother much more comfortable with 1-3 pain scale.  Infant transferred 60 ml for (L) breast.  Initial Feeding Assessment: Pre-feed [id number] Post-feed [id number] Amount Transferred:34ml Comments:  Additional Feeding Assessment:Infant placed in cross cradle hold on (R) breast. Lots of teaching on proper latch and using good pillow support. Infant  sustained latch for 15 mins. and  transferred 26ml. Pre-feed Weight: Post-feed Weight: Amount Transferred: Comments:  Additional Feeding Assessment: Pre-feed Weight: Post-feed Weight: Amount Transferred: Comments:  Total Breast milk Transferred this Visit:  Total Supplement Given:   Additional Interventions: I phoned Dr Isaias Cowman office and spoke with Dr Silvestre Mesi nurse French Ana). I request that she be seen today if possible to open milk bled. Mother to see Arlana Lindau NP at 4:00  Mother encourage to drain (L) breast well with infant feeding on (L) breast first. Instruct mother to massage firm area well before feed and use warm compresses  Instruct mother in massaging the back of nipple using forceful pressure in an upward motion to remove bleb. Mother was given a written teaching plan for plugged ducts with verbal instructions as well.  Mother to follow plan until  milk bleb resolved and to follow up with lactation services as needed.  Follow-Up  PRN    Stevan Born [facility] 09/23/2012, 2:09 PM

## 2012-09-24 ENCOUNTER — Ambulatory Visit (HOSPITAL_COMMUNITY): Payer: BC Managed Care – PPO

## 2013-03-04 HISTORY — PX: THYROID SURGERY: SHX805

## 2014-01-03 ENCOUNTER — Encounter (HOSPITAL_COMMUNITY): Payer: Self-pay | Admitting: Obstetrics and Gynecology

## 2016-08-31 ENCOUNTER — Emergency Department (HOSPITAL_BASED_OUTPATIENT_CLINIC_OR_DEPARTMENT_OTHER)
Admission: EM | Admit: 2016-08-31 | Discharge: 2016-08-31 | Disposition: A | Discharge status: Home / Self Care | Payer: BLUE CROSS/BLUE SHIELD | Attending: Emergency Medicine | Admitting: Emergency Medicine

## 2016-08-31 ENCOUNTER — Emergency Department (HOSPITAL_BASED_OUTPATIENT_CLINIC_OR_DEPARTMENT_OTHER): Payer: BLUE CROSS/BLUE SHIELD

## 2016-08-31 ENCOUNTER — Encounter (HOSPITAL_BASED_OUTPATIENT_CLINIC_OR_DEPARTMENT_OTHER): Payer: Self-pay | Admitting: *Deleted

## 2016-08-31 DIAGNOSIS — Z79899 Other long term (current) drug therapy: Secondary | ICD-10-CM | POA: Insufficient documentation

## 2016-08-31 DIAGNOSIS — N200 Calculus of kidney: Secondary | ICD-10-CM

## 2016-08-31 DIAGNOSIS — R1012 Left upper quadrant pain: Secondary | ICD-10-CM | POA: Diagnosis present

## 2016-08-31 HISTORY — DX: Disorder of thyroid, unspecified: E07.9

## 2016-08-31 LAB — CBC WITH DIFFERENTIAL/PLATELET
BASOS PCT: 0 %
Basophils Absolute: 0 10*3/uL (ref 0.0–0.1)
EOS ABS: 0.1 10*3/uL (ref 0.0–0.7)
EOS PCT: 1 %
HCT: 37.1 % (ref 36.0–46.0)
HEMOGLOBIN: 12.7 g/dL (ref 12.0–15.0)
LYMPHS ABS: 2.4 10*3/uL (ref 0.7–4.0)
Lymphocytes Relative: 18 %
MCH: 31.1 pg (ref 26.0–34.0)
MCHC: 34.2 g/dL (ref 30.0–36.0)
MCV: 90.9 fL (ref 78.0–100.0)
MONOS PCT: 9 %
Monocytes Absolute: 1.1 10*3/uL — ABNORMAL HIGH (ref 0.1–1.0)
NEUTROS PCT: 72 %
Neutro Abs: 9.4 10*3/uL — ABNORMAL HIGH (ref 1.7–7.7)
PLATELETS: 254 10*3/uL (ref 150–400)
RBC: 4.08 MIL/uL (ref 3.87–5.11)
RDW: 12.4 % (ref 11.5–15.5)
WBC: 13 10*3/uL — AB (ref 4.0–10.5)

## 2016-08-31 LAB — COMPREHENSIVE METABOLIC PANEL
ALK PHOS: 44 U/L (ref 38–126)
ALT: 13 U/L — AB (ref 14–54)
AST: 17 U/L (ref 15–41)
Albumin: 4.4 g/dL (ref 3.5–5.0)
Anion gap: 9 (ref 5–15)
BUN: 16 mg/dL (ref 6–20)
CALCIUM: 9.2 mg/dL (ref 8.9–10.3)
CO2: 24 mmol/L (ref 22–32)
CREATININE: 0.9 mg/dL (ref 0.44–1.00)
Chloride: 104 mmol/L (ref 101–111)
Glucose, Bld: 114 mg/dL — ABNORMAL HIGH (ref 65–99)
Potassium: 3.5 mmol/L (ref 3.5–5.1)
Sodium: 137 mmol/L (ref 135–145)
Total Bilirubin: 0.7 mg/dL (ref 0.3–1.2)
Total Protein: 7.3 g/dL (ref 6.5–8.1)

## 2016-08-31 LAB — URINALYSIS, ROUTINE W REFLEX MICROSCOPIC
BILIRUBIN URINE: NEGATIVE
Glucose, UA: NEGATIVE mg/dL
HGB URINE DIPSTICK: NEGATIVE
KETONES UR: 15 mg/dL — AB
NITRITE: POSITIVE — AB
PROTEIN: NEGATIVE mg/dL
Specific Gravity, Urine: 1.019 (ref 1.005–1.030)
pH: 7 (ref 5.0–8.0)

## 2016-08-31 LAB — URINALYSIS, MICROSCOPIC (REFLEX)

## 2016-08-31 LAB — PREGNANCY, URINE: PREG TEST UR: NEGATIVE

## 2016-08-31 MED ORDER — KETOROLAC TROMETHAMINE 15 MG/ML IJ SOLN
15.0000 mg | Freq: Once | INTRAMUSCULAR | Status: AC
Start: 2016-08-31 — End: 2016-08-31
  Administered 2016-08-31: 15 mg via INTRAVENOUS
  Filled 2016-08-31: qty 1

## 2016-08-31 MED ORDER — OXYCODONE-ACETAMINOPHEN 5-325 MG PO TABS: 1.0000 | ORAL_TABLET | Freq: Four times a day (QID) | ORAL | 0 refills | Status: DC | PRN

## 2016-08-31 MED ORDER — TAMSULOSIN HCL 0.4 MG PO CAPS: 0.4000 mg | ORAL_CAPSULE | Freq: Every day | ORAL | 0 refills | Status: AC

## 2016-08-31 MED ORDER — SODIUM CHLORIDE 0.9 % IV BOLUS (SEPSIS)
1000.0000 mL | Freq: Once | INTRAVENOUS | Status: AC
  Administered 2016-08-31: 1000 mL via INTRAVENOUS

## 2016-08-31 NOTE — ED Provider Notes (Signed)
[facility] Provider Note   CSN: [id number] Arrival date & time: 08/31/16  1821  By signing my name below, I, Thelma Barge, attest that this documentation has been prepared under the direction and in the presence of Tilden Fossa, MD. Electronically Signed: Thelma Barge, Scribe. 08/31/16. 7:26 PM. History   Chief Complaint Chief Complaint  Patient presents with  . Flank Pain  The history is provided by the patient. No language interpreter was used.    HPI Comments: Cheryl Medina is a 36 y.o. female who presents to the Emergency Department complaining of constant, severe/sharp abdominal pains for 1.5 hours. She states the pain radiates across her left flank and to her lower back. She has associated weakness. Pt was diagnosed with a UTI recently (she reports increased urgency and bladder spasms when she was diagnosed) and has been taking multiple antibiotics. She has been taking ciprofloxacin for 5 days. She denies fever and vomiting. Pt has no other pertinent medical problems. Past Medical History:  Diagnosis Date  . Allergic reaction   . GERD (gastroesophageal reflux disease)   . Postpartum care following cesarean delivery (08/28/12) 08/29/2012  . S/P repeat low transverse C-section 08/29/2012  . Thyroid disease     Patient Active Problem List   Diagnosis Date Noted  . Postpartum care following cesarean delivery (08/28/12) 08/29/2012  . S/P repeat low transverse C-section 08/29/2012    Past Surgical History:  Procedure Laterality Date  . BLADDER SURGERY    . CESAREAN SECTION    . CESAREAN SECTION N/A 08/28/2012   Procedure: REPEAT CESAREAN SECTION;  Surgeon: [name], MD;  Location: [facility];  Service: Obstetrics;  Laterality: N/A;  EDD 09/02/12  . WISDOM TOOTH EXTRACTION      OB History    Gravida Para Term Preterm AB Living   2 2 2     2    SAB TAB Ectopic Multiple Live Births           2       Home Medications    Prior to Admission medications     Medication Sig Start Date End Date Taking? Authorizing Provider  Ciprofloxacin (CIPRO PO) Take by mouth.   Yes [provider]  calcium carbonate (TUMS - DOSED IN MG ELEMENTAL CALCIUM) 500 MG chewable tablet Chew 1 tablet by mouth as needed for heartburn.     [provider]  cetirizine (ZYRTEC) 10 MG tablet Take 10 mg by mouth daily.    [provider]  DiphenhydrAMINE HCl (BENADRYL PO) Take 1 tablet by mouth at bedtime as needed (allergies).    [provider]  docusate sodium (COLACE) 100 MG capsule Take 100 mg by mouth 2 (two) times daily.    [provider]  Famotidine (PEPCID PO) Take 1 tablet by mouth daily as needed (reflux).    [provider]  Fe Cbn-Fe Gluc-FA-B12-C-DSS (FERRALET 90) 90-1 MG TABS Take 1 tablet by mouth daily. 08/30/12   Arlana Lindau, NP  ibuprofen (ADVIL,MOTRIN) 600 MG tablet Take 1 tablet (600 mg total) by mouth every 6 (six) hours as needed for pain. 08/30/12   Arlana Lindau, NP  Loperamide HCl (IMODIUM A-D PO) Take 1 capsule by mouth as needed.    [provider]  oxyCODONE-acetaminophen (PERCOCET/ROXICET) 5-325 MG tablet Take 1 tablet by mouth every 6 (six) hours as needed for severe pain. 08/31/16   Tilden Fossa, MD  Prenatal Vit-Fe Fumarate-FA (PRENATAL MULTIVITAMIN) TABS Take 1 tablet by mouth daily at  12 noon.    [provider]  tamsulosin (FLOMAX) 0.4 MG CAPS capsule Take 1 capsule (0.4 mg total) by mouth daily. 08/31/16   Tilden Fossaees, Nisaiah Bechtol, MD    Family History No family history on file.  Social History Social History  Substance Use Topics  . Smoking status: Never Smoker  . Smokeless tobacco: Never Used  . Alcohol use Yes     Comment: occ     Allergies   Patient has no known allergies.   Review of Systems Review of Systems  Constitutional: Negative for fever.  Gastrointestinal: Positive for abdominal pain. Negative for vomiting.  Genitourinary: Positive for flank pain.   Musculoskeletal: Positive for back pain.  Neurological: Positive for weakness.  All other systems reviewed and are negative.    Physical Exam Updated Vital Signs BP 119/72 (BP Location: Right Arm)   Pulse 88   Temp 98.9 F (37.2 C) (Oral)   Resp (!) 22   Ht 5\' 5"  (1.651 m)   Wt 80.7 kg (178 lb)   LMP 08/02/2016 (Approximate)   SpO2 97%   BMI 29.62 kg/m   Physical Exam  Constitutional: She is oriented to person, place, and time. She appears well-developed and well-nourished.  Uncomfortable appearing  HENT:  Head: Normocephalic and atraumatic.  Cardiovascular: Normal rate and regular rhythm.   No murmur heard. Pulmonary/Chest: Effort normal and breath sounds normal. No respiratory distress.  Abdominal: Soft. There is tenderness. There is no rebound and no guarding.  Mild LLQ tenderness  Genitourinary:  Genitourinary Comments:    Musculoskeletal: She exhibits no edema or tenderness.  Neurological: She is alert and oriented to person, place, and time.  Skin: Skin is warm and dry.  Psychiatric: She has a normal mood and affect. Her behavior is normal.  Nursing note and vitals reviewed.    ED Treatments / Results  DIAGNOSTIC STUDIES: Oxygen Saturation is 98% on RA, normal by my interpretation.    COORDINATION OF CARE: 7:17 PM Discussed treatment plan with pt at bedside and pt agreed to plan.  Labs (all labs ordered are listed, but only abnormal results are displayed) Labs Reviewed  URINALYSIS, ROUTINE W REFLEX MICROSCOPIC - Abnormal; Notable for the following:       Result Value   Color, Urine ORANGE (*)    Ketones, ur 15 (*)    Nitrite POSITIVE (*)    Leukocytes, UA SMALL (*)    All other components within normal limits  URINALYSIS, MICROSCOPIC (REFLEX) - Abnormal; Notable for the following:    Bacteria, UA FEW (*)    Squamous Epithelial / LPF 0-5 (*)    All other components within normal limits  COMPREHENSIVE METABOLIC PANEL - Abnormal; Notable for the  following:    Glucose, Bld 114 (*)    ALT 13 (*)    All other components within normal limits  CBC WITH DIFFERENTIAL/PLATELET - Abnormal; Notable for the following:    WBC 13.0 (*)    Neutro Abs 9.4 (*)    Monocytes Absolute 1.1 (*)    All other components within normal limits  URINE CULTURE  PREGNANCY, URINE    EKG  EKG Interpretation None       Radiology Ct Renal Stone Study  Result Date: 08/31/2016 CLINICAL DATA:  Urgency, frequency.  Left flank pain. EXAM: CT ABDOMEN AND PELVIS WITHOUT CONTRAST TECHNIQUE: Multidetector CT imaging of the abdomen and pelvis was performed following the standard protocol without IV contrast. COMPARISON:  None. FINDINGS: Lower chest: Lung bases  are clear. No effusions. Heart is normal size. Hepatobiliary: No focal hepatic abnormality. Gallbladder unremarkable. Pancreas: No focal abnormality or ductal dilatation. Spleen: No focal abnormality.  Normal size. Adrenals/Urinary Tract: Punctate nonobstructing stones in the lower pole of both kidneys. There is mild left hydronephrosis. 7 mm stone at the left ureterovesical junction. Adrenal glands and urinary bladder unremarkable. Stomach/Bowel: Appendix is normal. Stomach, large and small bowel grossly unremarkable. Vascular/Lymphatic: No evidence of aneurysm or adenopathy. Reproductive: Uterus and adnexa unremarkable.  No mass. Other: Trace free fluid in the pelvis.  No free air. Musculoskeletal: No acute bony abnormality. IMPRESSION: Bilateral nephrolithiasis. 7 mm left UVJ stone with mild left hydronephrosis. Electronically Signed   By: Charlett Nose M.D.   On: 08/31/2016 20:04    Procedures Procedures (including critical care time)  Medications Ordered in ED Medications  sodium chloride 0.9 % bolus 1,000 mL (0 mLs Intravenous Stopped 08/31/16 2044)  ketorolac (TORADOL) 15 MG/ML injection 15 mg (15 mg Intravenous Given 08/31/16 1933)     Initial Impression / Assessment and Plan / ED Course  I have  reviewed the triage vital signs and the nursing notes.  Pertinent labs & imaging results that were available during my care of the patient were reviewed by me and considered in my medical decision making (see chart for details).     Patient here for evaluation of 3 weeks of urinary symptoms with a severe lower abdominal pain/back pain today. Patient initially in considerable distress but pain controlled after Toradol in the emergency department. CT scan demonstrates left UVJ stone. Do not feel that infection is likely at this time given patient's symptoms, lack of fever. Discussed the case with Dr. Annabell Howells with urology who recommends home care with pain control, Flomax, outpatient follow-up and return precautions. D/w patient treatment plan with very close return precautions. Discussed continuing her ciprofloxacin for now.  Final Clinical Impressions(s) / ED Diagnoses   Final diagnoses:  Kidney stone    New Prescriptions New Prescriptions   OXYCODONE-ACETAMINOPHEN (PERCOCET/ROXICET) 5-325 MG TABLET    Take 1 tablet by mouth every 6 (six) hours as needed for severe pain.   TAMSULOSIN (FLOMAX) 0.4 MG CAPS CAPSULE    Take 1 capsule (0.4 mg total) by mouth daily.   I personally performed the services described in this documentation, which was scribed in my presence. The recorded information has been reviewed and is accurate.     Tilden Fossa, MD 08/31/16 2104

## 2016-08-31 NOTE — ED Notes (Signed)
ED Provider at bedside. 

## 2016-08-31 NOTE — Discharge Instructions (Signed)
You have a 7 mm stone in your left ureter. Get rechecked immediately if you develop fevers, can't urinate, or severe uncontrolled pain.

## 2016-08-31 NOTE — ED Notes (Signed)
Pt discharged to home with family. NAD.  

## 2016-08-31 NOTE — ED Triage Notes (Signed)
Pt reports urinary symptoms x3wks -- reports being treated with several antibiotics (currently on Cipro) with no improvement in symptoms (urgency, frequency). Pt had sudden L lower back/flank pain approx 1 hr ago. Denies fever, vomiting. Reports nausea.

## 2016-08-31 NOTE — ED Notes (Signed)
Iv attempted, pt screaming asking for numbing cream.

## 2016-09-02 LAB — URINE CULTURE

## 2016-10-28 ENCOUNTER — Other Ambulatory Visit: Payer: Self-pay | Admitting: Urology

## 2016-11-01 ENCOUNTER — Encounter (HOSPITAL_COMMUNITY): Payer: Self-pay | Admitting: *Deleted

## 2016-11-07 ENCOUNTER — Encounter (HOSPITAL_COMMUNITY)
Admission: RE | Disposition: A | Discharge status: Home / Self Care | Payer: Self-pay | Source: Ambulatory Visit | Attending: Urology

## 2016-11-07 ENCOUNTER — Encounter (HOSPITAL_COMMUNITY): Payer: Self-pay | Admitting: General Practice

## 2016-11-07 ENCOUNTER — Ambulatory Visit (HOSPITAL_COMMUNITY)
Admission: RE | Admit: 2016-11-07 | Discharge: 2016-11-07 | Disposition: A | Discharge status: Home / Self Care | Payer: BLUE CROSS/BLUE SHIELD | Source: Ambulatory Visit | Attending: Urology | Admitting: Urology

## 2016-11-07 ENCOUNTER — Ambulatory Visit (HOSPITAL_COMMUNITY): Payer: BLUE CROSS/BLUE SHIELD

## 2016-11-07 DIAGNOSIS — Z833 Family history of diabetes mellitus: Secondary | ICD-10-CM | POA: Insufficient documentation

## 2016-11-07 DIAGNOSIS — N201 Calculus of ureter: Secondary | ICD-10-CM | POA: Insufficient documentation

## 2016-11-07 DIAGNOSIS — Z841 Family history of disorders of kidney and ureter: Secondary | ICD-10-CM | POA: Insufficient documentation

## 2016-11-07 DIAGNOSIS — Z8249 Family history of ischemic heart disease and other diseases of the circulatory system: Secondary | ICD-10-CM | POA: Diagnosis not present

## 2016-11-07 HISTORY — DX: Unspecified osteoarthritis, unspecified site: M19.90

## 2016-11-07 HISTORY — DX: Personal history of urinary calculi: Z87.442

## 2016-11-07 HISTORY — PX: EXTRACORPOREAL SHOCK WAVE LITHOTRIPSY: SHX1557

## 2016-11-07 LAB — PREGNANCY, URINE: PREG TEST UR: NEGATIVE

## 2016-11-07 SURGERY — LITHOTRIPSY, ESWL
Anesthesia: LOCAL | Laterality: Left

## 2016-11-07 MED ORDER — DIAZEPAM 5 MG PO TABS
10.0000 mg | ORAL_TABLET | ORAL | Status: AC
  Administered 2016-11-07: 10 mg via ORAL
  Filled 2016-11-07: qty 2

## 2016-11-07 MED ORDER — CIPROFLOXACIN HCL 500 MG PO TABS
500.0000 mg | ORAL_TABLET | ORAL | Status: AC
  Administered 2016-11-07: 500 mg via ORAL
  Filled 2016-11-07: qty 1

## 2016-11-07 MED ORDER — SODIUM CHLORIDE 0.9 % IV SOLN
INTRAVENOUS | Status: DC
  Administered 2016-11-07: 09:00:00 via INTRAVENOUS

## 2016-11-07 MED ORDER — DIPHENHYDRAMINE HCL 25 MG PO CAPS
25.0000 mg | ORAL_CAPSULE | ORAL | Status: AC
  Administered 2016-11-07: 25 mg via ORAL
  Filled 2016-11-07: qty 1

## 2016-11-07 NOTE — Discharge Instructions (Signed)
See [facility] discharge instructions in chart.  

## 2016-11-07 NOTE — H&P (Signed)
Cheryl Medina is a 36 year-old female patient who is here for renal calculi.  The problem is on the left side. She first stated noticing pain on 07/30/2016. This is her first kidney stone. She is not currently having flank pain, back pain, groin pain, nausea, vomiting, fever or chills. She has not caught a stone in her urine strainer since her symptoms began.   She has never had surgical treatment for calculi in the past.   10/25/2016: The end of May she developed urinary urgency, frequency and dysuria. She then developed severe sharp, constant nonradiating left flank pain. She went to the ER and was diagnosed with a 37m distal left ureteral calculus. She does not know if she passed the stone. She is having no pain. She continues to have frequency, urgency, and dysuria     ALLERGIES: None   MEDICATIONS: Juice Plus  Serenelle  Stress J     GU PSH: Initial Female Urethral Dilation      PSH Notes: Hypo-Thyroid removal-2015   NON-GU PSH: C-Section - about 2014    GU PMH: No GU PMH    NON-GU PMH: No Non-GU PMH    FAMILY HISTORY: 1 Daughter - Daughter 1 son - Son Dementia - Grandfather Diabetes - Grandmother Heart Disease - Grandfather, Grandmother Hypertension - Grandfather Kidney Stones - Grandfather   SOCIAL HISTORY: Marital Status: Married Preferred Language: English; Ethnicity: Not Hispanic Or Latino; Race: White Current Smoking Status: Patient has never smoked.   Tobacco Use Assessment Completed: Used Tobacco in last 30 days? Has never drank.  Drinks 2 caffeinated drinks per day. 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 Patient denies burning /pain with urination, leakage of urine, stream starts and stops, trouble starting your stream, have to strain to urinate, and being pregnant.  Gastrointestinal (Upper):   Patient denies nausea,  vomiting, and indigestion/ heartburn.  Gastrointestinal (Lower):   Patient denies diarrhea and constipation.  Constitutional:   Patient denies fever, night sweats, weight loss, and fatigue.  Skin:   Patient denies skin rash/ lesion and itching.  Eyes:   Patient denies blurred vision and double vision.  Ears/ Nose/ Throat:   Patient denies sore throat and sinus problems.  Hematologic/Lymphatic:   Patient denies swollen glands and easy bruising.  Cardiovascular:   Patient denies leg swelling and chest pains.  Respiratory:   Patient denies cough and shortness of breath.  Endocrine:   Patient denies excessive thirst.  Musculoskeletal:   Patient reports joint pain. Patient denies back pain.  Neurological:   Patient denies dizziness and headaches.  Psychologic:   Patient denies depression and anxiety.   VITAL SIGNS: None   MULTI-SYSTEM PHYSICAL EXAMINATION:    Constitutional: Well-nourished. No physical deformities. Normally developed. Good grooming.  Neck: Neck symmetrical, not swollen. Normal tracheal position.  Respiratory: No labored breathing, no use of accessory muscles.   Cardiovascular: Normal temperature, normal extremity pulses, no swelling, no varicosities.  Lymphatic: No enlargement of neck, axillae, groin.  Skin: No paleness, no jaundice, no cyanosis. No lesion, no ulcer, no rash.  Neurologic / Psychiatric: Oriented to time, oriented to place, oriented to person. No depression, no anxiety, no agitation.  Gastrointestinal: No mass, no tenderness, no rigidity, non obese abdomen.  Eyes: Normal conjunctivae. Normal eyelids.  Ears, Nose, Mouth, and Throat: Left ear no scars, no lesions, no masses.  Right ear no scars, no lesions, no masses. Nose no scars, no lesions, no masses. Normal hearing. Normal lips.  Musculoskeletal: Normal gait and station of head and neck.     PAST DATA REVIEWED:  Source Of History:  Patient   PROCEDURES:         KUB - [id number]  A single view of the abdomen  is obtained.  Bony Abnormalities:  no bony abnormalities  Fecal Stasis:  Mild fecal stasis.  Soft Tissue:  no soft tissue abnormalities  Calculi:  Distal left ureter calculi. Left Ureteral calcification size: 52m               Urinalysis w/Scope Dipstick Dipstick Cont'd Micro  Color: Yellow Bilirubin: Neg WBC/hpf: 0 - 5/hpf  Appearance: Clear Ketones: Neg RBC/hpf: 0 - 2/hpf  Specific Gravity: 1.010 Blood: Trace Bacteria: NS (Not Seen)  pH: 6.5 Protein: Neg Cystals: NS (Not Seen)  Glucose: Neg Urobilinogen: 0.2 Casts: NS (Not Seen)    Nitrites: Neg Trichomonas: Not Present    Leukocyte Esterase: Neg Mucous: Not Present      Epithelial Cells: 0 - 5/hpf      Yeast: NS (Not Seen)      Sperm: Not Present    ASSESSMENT:      ICD-10 Details  1 GU:   Ureteral calculus - N20.1    PLAN:           Orders X-Rays: KUB          Schedule         Document Letter(s):  Created for Patient: Clinical Summary         Notes:   Left ureteral calculus:  -We discussed MET versus URS versus ESWL and the patient elects for ESWL.

## 2016-11-07 NOTE — Interval H&P Note (Signed)
History and Physical Interval Note:  11/07/2016 10:00 AM  Cheryl Medina  has presented today for surgery, with the diagnosis of LEFT URETERAL CALCULUS  The various methods of treatment have been discussed with the patient and family. After consideration of risks, benefits and other options for treatment, the patient has consented to  Procedure(s): LEFT EXTRACORPOREAL SHOCK WAVE LITHOTRIPSY (ESWL) (Left) as a surgical intervention .  The patient's history has been reviewed, patient examined, no change in status, stable for surgery.  I have reviewed the patient's chart and labs.  Questions were answered to the patient's satisfaction.     Berniece SalinesHERRICK, [name]

## 2016-11-07 NOTE — Op Note (Signed)
See [facility] OP note scanned into chart. Also because of the size, density, location and other factors that cannot be anticipated I feel this will likely be a staged procedure. This fact supersedes any indication in the scanned [facility] operative note to the contrary.  

## 2020-02-22 ENCOUNTER — Emergency Department (HOSPITAL_BASED_OUTPATIENT_CLINIC_OR_DEPARTMENT_OTHER): Payer: BC Managed Care – PPO

## 2020-02-22 ENCOUNTER — Other Ambulatory Visit: Payer: Self-pay

## 2020-02-22 ENCOUNTER — Emergency Department (HOSPITAL_BASED_OUTPATIENT_CLINIC_OR_DEPARTMENT_OTHER)
Admission: EM | Admit: 2020-02-22 | Discharge: 2020-02-22 | Disposition: A | Discharge status: Home / Self Care | Payer: BC Managed Care – PPO | Attending: Emergency Medicine | Admitting: Emergency Medicine

## 2020-02-22 ENCOUNTER — Encounter (HOSPITAL_BASED_OUTPATIENT_CLINIC_OR_DEPARTMENT_OTHER): Payer: Self-pay

## 2020-02-22 DIAGNOSIS — G8929 Other chronic pain: Secondary | ICD-10-CM | POA: Insufficient documentation

## 2020-02-22 DIAGNOSIS — M545 Low back pain, unspecified: Secondary | ICD-10-CM | POA: Insufficient documentation

## 2020-02-22 DIAGNOSIS — Z87442 Personal history of urinary calculi: Secondary | ICD-10-CM | POA: Insufficient documentation

## 2020-02-22 LAB — PREGNANCY, URINE: Preg Test, Ur: NEGATIVE

## 2020-02-22 MED ORDER — LIDOCAINE 5 % EX PTCH
1.0000 | MEDICATED_PATCH | CUTANEOUS | Status: DC
  Administered 2020-02-22: 21:00:00 1 via TRANSDERMAL
  Filled 2020-02-22: qty 1

## 2020-02-22 MED ORDER — OXYCODONE-ACETAMINOPHEN 5-325 MG PO TABS
1.0000 | ORAL_TABLET | Freq: Once | ORAL | Status: AC
  Administered 2020-02-22: 21:00:00 1 via ORAL
  Filled 2020-02-22: qty 1

## 2020-02-22 MED ORDER — TIZANIDINE HCL 4 MG PO TABS: 4.0000 mg | ORAL_TABLET | Freq: Four times a day (QID) | ORAL | 0 refills | Status: AC | PRN

## 2020-02-22 MED ORDER — KETOROLAC TROMETHAMINE 30 MG/ML IJ SOLN
30.0000 mg | Freq: Once | INTRAMUSCULAR | Status: AC
  Administered 2020-02-22: 21:00:00 30 mg via INTRAMUSCULAR
  Filled 2020-02-22: qty 1

## 2020-02-22 NOTE — ED Triage Notes (Signed)
Pt arrives complaining of lower back pain since Saturday, denies known injury. States has occurred a few times over the past years, get injections for pain, has been unable to determine cause of pain. Has been taking muscle relaxer and advil without relief. Unable to sit up without pain. Tingling down legs

## 2020-02-22 NOTE — ED Provider Notes (Signed)
[facility] EMERGENCY DEPARTMENT Provider Note   CSN: [id number] Arrival date & time: 02/22/20  1952     History Chief Complaint  Patient presents with  . Back Pain    Cheryl Medina is a 39 y.o. female.  HPI Patient is a 39 year old female with a 6 or 7-year history of back pain.  She states it is always in the same area all the way across her low back.  She states that her symptoms started again 3 days ago on Saturday.  She states that she does work out regularly.  She denies any heavy lifting or new exercises however.  She states that she has had this occur several times in the past and has gotten a shot of some kind in her back.  She thinks it may be a steroid shot but is not entirely sure.  History without symptoms of urinary or stool retention or incontinence, neurologic changes such as sensation change or weakness lower extremities, coagulopathy or blood thinner use, is not elderly or with history of osteoporosis, denies any history of cancer, fever, IV drug use, weight changes (unexplained), or prolonged steroid use.      Past Medical History:  Diagnosis Date  . Allergic reaction   . Arthritis    lrft knee  . GERD (gastroesophageal reflux disease)   . History of kidney stones   . Postpartum care following cesarean delivery (08/28/12) 08/29/2012  . S/P repeat low transverse C-section 08/29/2012  . Thyroid disease     Patient Active Problem List   Diagnosis Date Noted  . Postpartum care following cesarean delivery (08/28/12) 08/29/2012  . S/P repeat low transverse C-section 08/29/2012    Past Surgical History:  Procedure Laterality Date  . BLADDER SURGERY    . CESAREAN SECTION    . CESAREAN SECTION N/A 08/28/2012   Procedure: REPEAT CESAREAN SECTION;  Surgeon: Lenoard Adenichard J Taavon, MD;  Location: [facility];  Service: Obstetrics;  Laterality: N/A;  EDD 09/02/12  . EXTRACORPOREAL SHOCK WAVE LITHOTRIPSY Left 11/07/2016   Procedure: LEFT EXTRACORPOREAL SHOCK WAVE  LITHOTRIPSY (ESWL);  Surgeon: Crist FatHerrick, Benjamin W, MD;  Location: [facility];  Service: Urology;  Laterality: Left;  . THYROID SURGERY  2015  . WISDOM TOOTH EXTRACTION       OB History    Gravida  2   Para  2   Term  2   Preterm      AB      Living  2     SAB      IAB      Ectopic      Multiple      Live Births  2           History reviewed. No pertinent family history.  Social History   Tobacco Use  . Smoking status: Never Smoker  . Smokeless tobacco: Never Used  Vaping Use  . Vaping Use: Never used  Substance Use Topics  . Alcohol use: Yes    Comment: glass of wine daily  . Drug use: No    Home Medications Prior to Admission medications   Medication Sig Start Date End Date Taking? Authorizing Provider  calcium carbonate (TUMS - DOSED IN MG ELEMENTAL CALCIUM) 500 MG chewable tablet Chew 1 tablet by mouth as needed for heartburn.     [provider]  cetirizine (ZYRTEC) 10 MG tablet Take 10 mg by mouth daily.    [provider]  DiphenhydrAMINE HCl (BENADRYL PO) Take 1 tablet  by mouth at bedtime as needed (allergies).    [provider]  docusate sodium (COLACE) 100 MG capsule Take 100 mg by mouth 2 (two) times daily.    [provider]  Famotidine (PEPCID PO) Take 1 tablet by mouth daily as needed (reflux).    [provider]  Fe Cbn-Fe Gluc-FA-B12-C-DSS (FERRALET 90) 90-1 MG TABS Take 1 tablet by mouth daily. 08/30/12   Arlana Lindau, NP  ibuprofen (ADVIL,MOTRIN) 600 MG tablet Take 1 tablet (600 mg total) by mouth every 6 (six) hours as needed for pain. 08/30/12   Arlana Lindau, NP  Loperamide HCl (IMODIUM A-D PO) Take 1 capsule by mouth as needed.    [provider]  Prenatal Vit-Fe Fumarate-FA (PRENATAL MULTIVITAMIN) TABS Take 1 tablet by mouth daily at 12 noon.    [provider]  tamsulosin (FLOMAX) 0.4 MG CAPS capsule Take 1 capsule (0.4 mg total) by mouth daily. 08/31/16   Tilden Fossa, MD   tiZANidine (ZANAFLEX) 4 MG tablet Take 1 tablet (4 mg total) by mouth every 6 (six) hours as needed for muscle spasms. 02/22/20   Gailen Shelter, PA    Allergies    Patient has no known allergies.  Review of Systems   Review of Systems  Constitutional: Negative for chills and fever.  HENT: Negative for congestion.   Eyes: Negative for pain.  Respiratory: Negative for cough and shortness of breath.   Cardiovascular: Negative for chest pain and leg swelling.  Gastrointestinal: Negative for abdominal pain and vomiting.  Genitourinary: Negative for dysuria.  Musculoskeletal: Positive for back pain. Negative for myalgias.  Skin: Negative for rash.  Neurological: Negative for dizziness and headaches.    Physical Exam Updated Vital Signs BP 96/60 (BP Location: Right Arm)   Pulse 65   Temp 98.7 F (37.1 C) (Oral)   Resp (!) 72   Ht 5\' 5"  (1.651 m)   Wt 81.6 kg   LMP 02/17/2020   SpO2 95%   BMI 29.95 kg/m   Physical Exam Vitals and nursing note reviewed.  Constitutional:      General: She is not in acute distress. HENT:     Head: Normocephalic and atraumatic.     Nose: Nose normal.  Eyes:     General: No scleral icterus. Cardiovascular:     Rate and Rhythm: Normal rate and regular rhythm.     Pulses: Normal pulses.     Heart sounds: Normal heart sounds.  Pulmonary:     Effort: Pulmonary effort is normal. No respiratory distress.     Breath sounds: No wheezing.  Abdominal:     Palpations: Abdomen is soft.     Tenderness: There is no abdominal tenderness.  Musculoskeletal:     Cervical back: Normal range of motion.     Right lower leg: No edema.     Left lower leg: No edema.     Comments: Diffuse muscular tenderness of the low back.  No midline tenderness.  Flexion-extension of ankle and knees 5/5.  No bony tenderness of C, T, L-spine.  No bony tenderness of hips or legs.  Skin:    General: Skin is warm and dry.     Capillary Refill: Capillary refill takes less  than 2 seconds.  Neurological:     Mental Status: She is alert. Mental status is at baseline.     Comments: Sensation intact in bilateral lower extremities patient is able to lift legs off the ground without difficulty.  Psychiatric:  Mood and Affect: Mood normal.        Behavior: Behavior normal.     ED Results / Procedures / Treatments   Labs (all labs ordered are listed, but only abnormal results are displayed) Labs Reviewed  PREGNANCY, URINE    EKG None  Radiology DG Lumbar Spine Complete  Result Date: 02/22/2020 CLINICAL DATA:  Low back pain, atraumatic.  Concern for bone lesion. EXAM: LUMBAR SPINE - COMPLETE 4+ VIEW COMPARISON:  CT renal 08/31/2016 FINDINGS: Five non-rib-bearing lumbar vertebral bodies. There is no evidence of lumbar spine fracture. Alignment is normal. Intervertebral disc spaces are maintained. Stool throughout the colon. IMPRESSION: 1. No acute displaced fracture or traumatic listhesis of the lumbar spine. 2. Please note markedly limited evaluation a bone lesion due to overlying soft tissues and bowel. Electronically Signed   By: Tish Frederickson M.D.   On: 02/22/2020 22:16    Procedures Procedures (including critical care time)  Medications Ordered in ED Medications  lidocaine (LIDODERM) 5 % 1 patch (1 patch Transdermal Patch Applied 02/22/20 2129)  oxyCODONE-acetaminophen (PERCOCET/ROXICET) 5-325 MG per tablet 1 tablet (1 tablet Oral Given 02/22/20 2128)  ketorolac (TORADOL) 30 MG/ML injection 30 mg (30 mg Intramuscular Given 02/22/20 2128)    ED Course  I have reviewed the triage vital signs and the nursing notes.  Pertinent labs & imaging results that were available during my care of the patient were reviewed by me and considered in my medical decision making (see chart for details).    MDM Rules/Calculators/A&P                          Patient with acute on chronic back pain.  She is 39 year old female has no red flag symptoms.  Broad  differential for back pain considered includes malignancy, disc herniation, spinal epidural abscess, spinal fracture, cauda equina, pyelonephritis, kidney stone, AAA, AD, pancreatitis, PE and PTX.   History without symptoms of urinary or stool retention or incontinence, neurologic changes such as sensation change or weakness lower extremities, coagulopathy or blood thinner use, is not elderly or with history of osteoporosis, denies any history of cancer, fever, IV drug use, weight changes (unexplained), or prolonged steroid use.   Physical exam most consistent with muscular strain. Doubt cauda equina or disc herniation d/t lack of saddle anesthesia/bowel or bladder incontinence or urinary retention, normal gait and reassuring physical examination without neurologic deficits.   History is not supportive of kidney stone, AAA, AD, pancreatitis, PE or PTX. Patient has no CVA tenderness or urinary sx to suggest pyelonephritis or kidney stone.   Will manage patient conservatively at this time. NSAIDs, back exercises/stretches, heat therapy and follow up with PCP if symptoms do not resolve in 3-4 weeks. Patient offered muscle relaxer for comfort at night. Counseled on need to return to ED for fever, worsening or concerning symptoms. Patient agreeable to plan and states understanding of follow up plans and return precautions.    Vitals WNL at time of discharge and patient is no acute distress  Offered Toradol shot which she accepted.   Given the patient does have long history of back pain has not had any imaging per patient obtain plain film x-rays today.  I have low suspicion for bony lesion given her age she has no significant risk factors however given that this is been a recurrent issue it is reasonable to obtain screening films.  I also reviewed 2018 renal stone study at which time she  had no acute bony abnormality noted on CT read at that time.  Patient informed of plan to discharge with follow-up  with neurosurgery/spine doctor, as well as sports medicine.  She was given some additional Zanaflex which she may use at home.  She is given return precautions.  Final Clinical Impression(s) / ED Diagnoses Final diagnoses:  Chronic low back pain without sciatica, unspecified back pain laterality    Rx / DC Orders ED Discharge Orders         Ordered    tiZANidine (ZANAFLEX) 4 MG tablet  Every 6 hours PRN        02/22/20 2246           Gailen Shelter, [place] 02/22/20 2345    Charlynne Pander, MD 02/23/20 [phone]

## 2020-02-22 NOTE — Discharge Instructions (Addendum)
Your x-ray is reassuring today.  Please follow-up with the spine surgeon that I have given you the office information for.  I have also given you the information for Clare Gandy who is one of our sports medicine doctors.  You may be able to see him sooner and it would be reasonable to go ahead and schedule appointment with him.  Please use warm compresses, gentle stretching, the muscle relaxer that I have represcribed you that you have taken before and Tylenol and ibuprofen as discussed below.  Please use Tylenol or ibuprofen for pain.  You may use 600 mg ibuprofen every 6 hours or 1000 mg of Tylenol every 6 hours.  You may choose to alternate between the 2.  This would be most effective.  Not to exceed 4 g of Tylenol within 24 hours.  Not to exceed 3200 mg ibuprofen 24 hours.  You may always return to the ER for any new or concerning symptoms.  I have printed you some information about chronic back pain given that this is been an intermittent issue/acute episodes with ongoing back pain.  Please read the attached information.  You may also follow-up with your primary care doctor.

## 2020-02-24 ENCOUNTER — Ambulatory Visit: Payer: BC Managed Care – PPO | Admitting: Family Medicine

## 2020-02-24 ENCOUNTER — Other Ambulatory Visit: Payer: Self-pay

## 2020-02-24 VITALS — BP 96/66 | Ht 65.0 in | Wt 180.0 lb

## 2020-02-24 DIAGNOSIS — M545 Low back pain, unspecified: Secondary | ICD-10-CM

## 2020-02-24 NOTE — Assessment & Plan Note (Signed)
Mainly occurring over the SI joint.  Seems more muscle spasm in nature.  She does have a history of similar types of back pain.   -Counseled on home exercise therapy and supportive care. -Counseled on compression. -Could consider physical therapy or further imaging.

## 2020-02-24 NOTE — Patient Instructions (Signed)
Nice to meet you Please try compression  Please try heat  Please try the exercise   Please send me a message in MyChart with any questions or updates.  Please see me back in 2-3 weeks.   --Dr. Jordan Likes

## 2020-02-24 NOTE — Progress Notes (Signed)
Cheryl Medina - 38 y.o. female MRN [id number]  Date of birth: Mar 10, 1980  SUBJECTIVE:  Including CC & ROS.  No chief complaint on file.   Cheryl Medina is a 39 y.o. female that is presenting with acute on chronic low back pain.  The pain is localized to the lower back and the SI joints.  Pain has been ongoing since Saturday.  She has been more active with no new or different activities.  Has had a history of similar types of back pain.  Does seem to be getting improvement.  No radicular pain at this time..  Independent review of the lumbar spine x-ray from 12/29 shows no acute abnormalities.   Review of Systems See HPI   HISTORY: Past Medical, Surgical, Social, and Family History Reviewed & Updated per EMR.   Pertinent Historical Findings include:  Past Medical History:  Diagnosis Date  . Allergic reaction   . Arthritis    lrft knee  . GERD (gastroesophageal reflux disease)   . History of kidney stones   . Postpartum care following cesarean delivery (08/28/12) 08/29/2012  . S/P repeat low transverse C-section 08/29/2012  . Thyroid disease     Past Surgical History:  Procedure Laterality Date  . BLADDER SURGERY    . CESAREAN SECTION    . CESAREAN SECTION N/A 08/28/2012   Procedure: REPEAT CESAREAN SECTION;  Surgeon: [name], MD;  Location: [facility];  Service: Obstetrics;  Laterality: N/A;  EDD 09/02/12  . EXTRACORPOREAL SHOCK WAVE LITHOTRIPSY Left 11/07/2016   Procedure: LEFT EXTRACORPOREAL SHOCK WAVE LITHOTRIPSY (ESWL);  Surgeon: Crist Fat, MD;  Location: [facility];  Service: Urology;  Laterality: Left;  . THYROID SURGERY  2015  . WISDOM TOOTH EXTRACTION      No family history on file.  Social History   Socioeconomic History  . Marital status: Married    Spouse name: Not on file  . Number of children: Not on file  . Years of education: Not on file  . Highest education level: Not on file  Occupational History  . Not on file  Tobacco Use  . Smoking status:  Never Smoker  . Smokeless tobacco: Never Used  Vaping Use  . Vaping Use: Never used  Substance and Sexual Activity  . Alcohol use: Yes    Comment: glass of wine daily  . Drug use: No  . Sexual activity: Yes    Birth control/protection: None  Other Topics Concern  . Not on file  Social History Narrative  . Not on file   Social Determinants of Health   Financial Resource Strain: Not on file  Food Insecurity: Not on file  Transportation Needs: Not on file  Physical Activity: Not on file  Stress: Not on file  Social Connections: Not on file  Intimate Partner Violence: Not on file     PHYSICAL EXAM:  VS: BP 96/66   Ht 5\' 5"  (1.651 m)   Wt 180 lb (81.6 kg)   LMP 02/17/2020   BMI 29.95 kg/m  Physical Exam Gen: NAD, alert, cooperative with exam, well-appearing MSK:  Back: Limited flexion and extension. Normal strength resistance. Tenderness to palpation of the SI joints. Negative straight leg raise. Neurovascularly intact     ASSESSMENT & PLAN:   Acute bilateral low back pain without sciatica Mainly occurring over the SI joint.  Seems more muscle spasm in nature.  She does have a history of similar types of back pain.   -Counseled on home  exercise therapy and supportive care. -Counseled on compression. -Could consider physical therapy or further imaging.

## 2022-06-20 ENCOUNTER — Encounter: Payer: Self-pay | Admitting: *Deleted

## 2022-12-13 IMAGING — DX DG LUMBAR SPINE COMPLETE 4+V
5 series · 5 of 5 positions shown · non-contrast
Comparison: CT renal 08/31/2016

CLINICAL DATA: Low back pain, atraumatic.  Concern for bone lesion.

EXAM:
LUMBAR SPINE - COMPLETE 4+ VIEW

[l-spine ap]
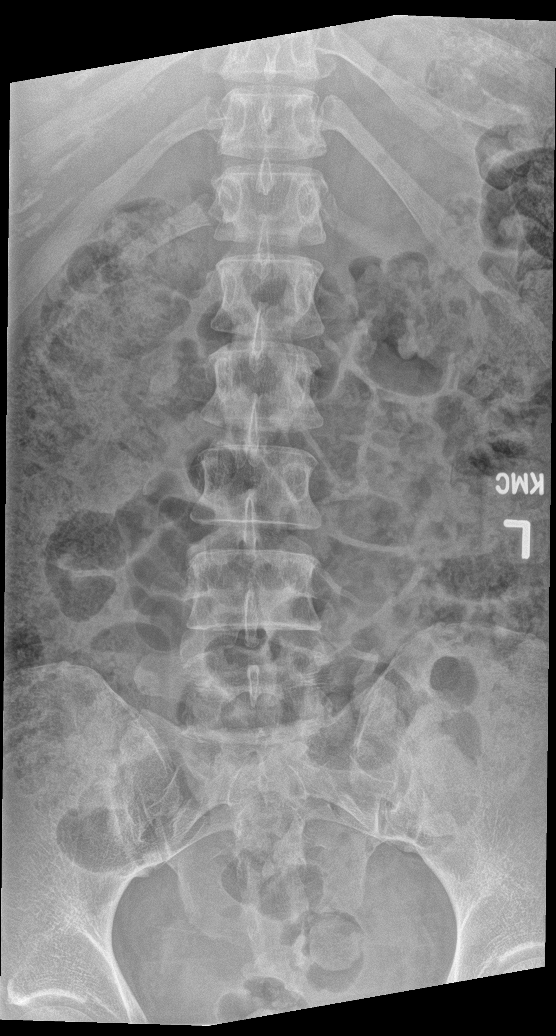

[l-spine obl (1 of 2)]
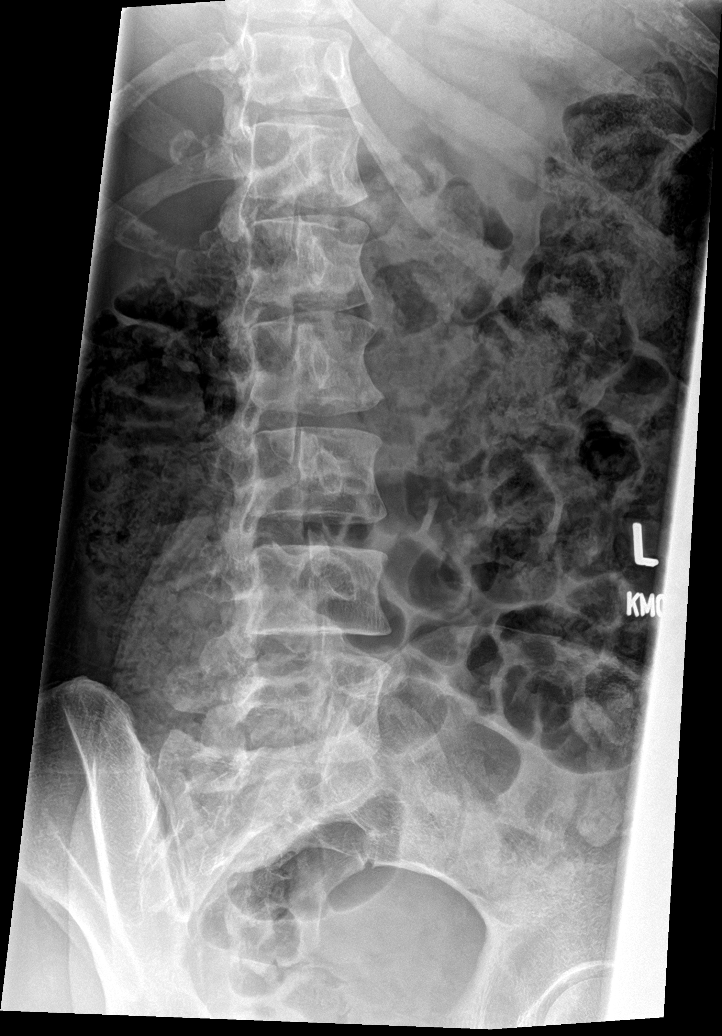

[l-spine obl (2 of 2)]
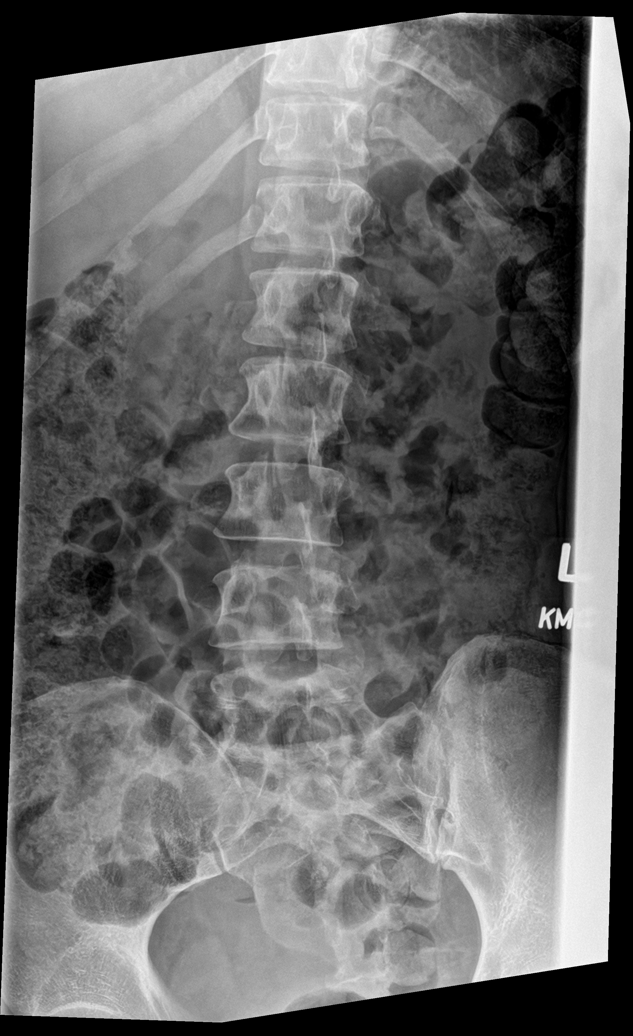

[l-spine lat]
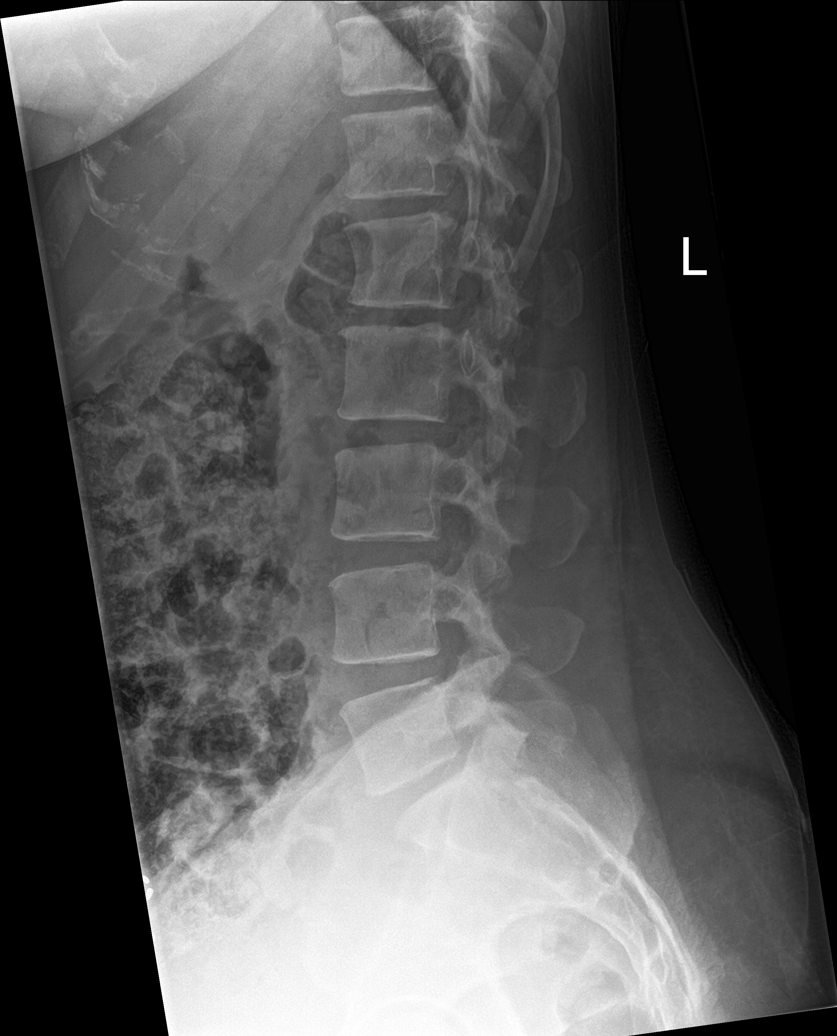

[l-spine spot]
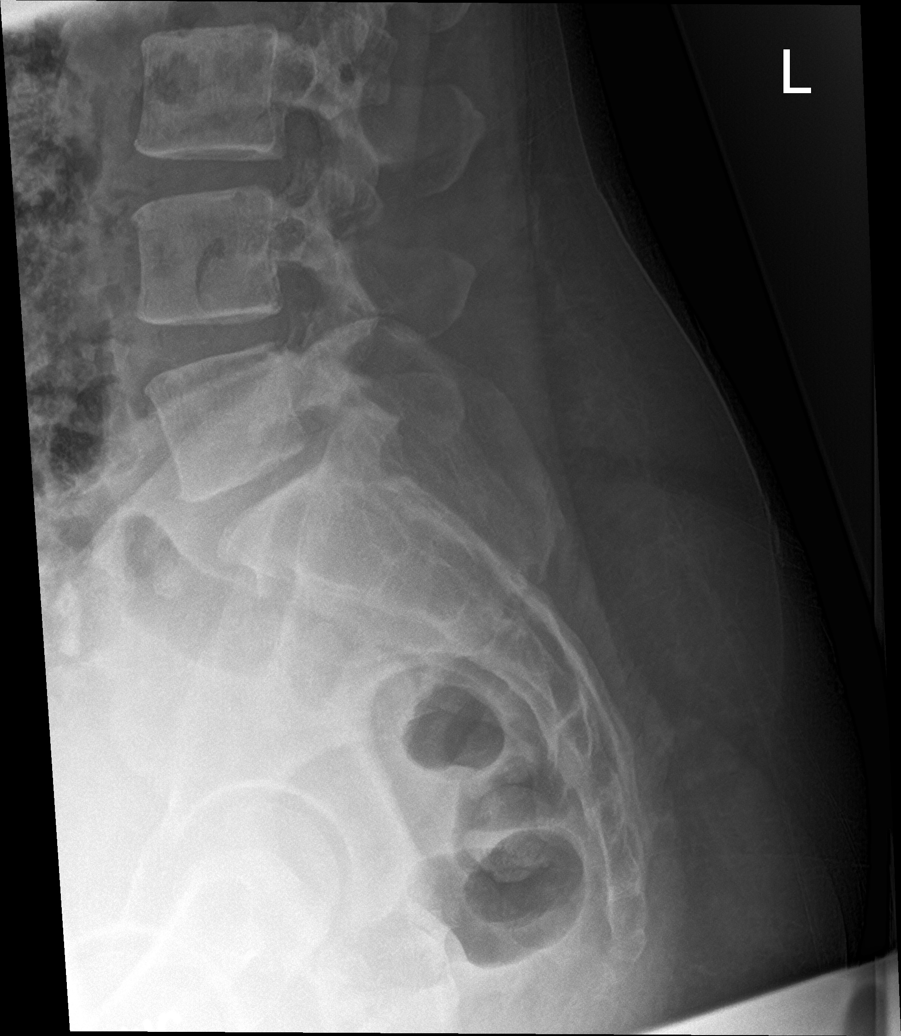

[5 of 5 positions shown; findings below may reference images not displayed]

FINDINGS: Five non-rib-bearing lumbar vertebral bodies. There is no evidence
of lumbar spine fracture. Alignment is normal. Intervertebral disc
spaces are maintained. Stool throughout the colon.
IMPRESSION: 1. No acute displaced fracture or traumatic listhesis of the lumbar
spine.
2. Please note markedly limited evaluation a bone lesion due to
overlying soft tissues and bowel.
